# Patient Record
Sex: Female | Born: 1935 | Race: White | Hispanic: No | Marital: Married | Smoking: Former smoker
Health system: Southern US, Community
[De-identification: ages and names within clinical notes are randomized; demographics above are authoritative.]

## PROBLEM LIST (undated history)

## (undated) DIAGNOSIS — M810 Age-related osteoporosis without current pathological fracture: Secondary | ICD-10-CM

## (undated) DIAGNOSIS — F0390 Unspecified dementia without behavioral disturbance: Secondary | ICD-10-CM

## (undated) DIAGNOSIS — H3530 Unspecified macular degeneration: Secondary | ICD-10-CM

## (undated) DIAGNOSIS — E785 Hyperlipidemia, unspecified: Secondary | ICD-10-CM

## (undated) HISTORY — PX: HIP SURGERY: SHX245

## (undated) HISTORY — PX: ABDOMINAL SURGERY: SHX537

---

## 2004-08-04 ENCOUNTER — Ambulatory Visit: Payer: Self-pay | Admitting: Internal Medicine

## 2005-09-28 ENCOUNTER — Ambulatory Visit: Payer: Self-pay | Admitting: Internal Medicine

## 2006-10-02 ENCOUNTER — Ambulatory Visit: Payer: Self-pay | Admitting: Internal Medicine

## 2007-10-03 ENCOUNTER — Ambulatory Visit: Payer: Self-pay | Admitting: Internal Medicine

## 2013-07-04 ENCOUNTER — Inpatient Hospital Stay: Payer: Self-pay | Admitting: Internal Medicine

## 2013-07-04 LAB — COMPREHENSIVE METABOLIC PANEL
Albumin: 3.4 g/dL (ref 3.4–5.0)
Alkaline Phosphatase: 74 U/L
BUN: 37 mg/dL — ABNORMAL HIGH (ref 7–18)
Calcium, Total: 9.6 mg/dL (ref 8.5–10.1)
Chloride: 107 mmol/L (ref 98–107)
Co2: 27 mmol/L (ref 21–32)
Creatinine: 1.36 mg/dL — ABNORMAL HIGH (ref 0.60–1.30)
Glucose: 138 mg/dL — ABNORMAL HIGH (ref 65–99)
SGOT(AST): 24 U/L (ref 15–37)
Sodium: 141 mmol/L (ref 136–145)

## 2013-07-04 LAB — URINALYSIS, COMPLETE
Bilirubin,UR: NEGATIVE
Glucose,UR: NEGATIVE mg/dL (ref 0–75)
Ph: 6 (ref 4.5–8.0)
Protein: 30
RBC,UR: 1 /HPF (ref 0–5)
Specific Gravity: 1.016 (ref 1.003–1.030)
WBC UR: 1 /HPF (ref 0–5)

## 2013-07-04 LAB — CBC
HGB: 13 g/dL (ref 12.0–16.0)
MCH: 31 pg (ref 26.0–34.0)
RDW: 14.5 % (ref 11.5–14.5)

## 2013-07-04 LAB — PROTIME-INR: INR: 0.9

## 2013-07-05 LAB — BASIC METABOLIC PANEL
Anion Gap: 9 (ref 7–16)
BUN: 28 mg/dL — ABNORMAL HIGH (ref 7–18)
Calcium, Total: 8.6 mg/dL (ref 8.5–10.1)
EGFR (African American): 45 — ABNORMAL LOW
EGFR (Non-African Amer.): 39 — ABNORMAL LOW
Glucose: 126 mg/dL — ABNORMAL HIGH (ref 65–99)
Osmolality: 277 (ref 275–301)
Potassium: 3.8 mmol/L (ref 3.5–5.1)

## 2013-07-05 LAB — CBC WITH DIFFERENTIAL/PLATELET
Basophil #: 0 10*3/uL (ref 0.0–0.1)
Basophil %: 0.1 %
Eosinophil #: 0 10*3/uL (ref 0.0–0.7)
HCT: 30 % — ABNORMAL LOW (ref 35.0–47.0)
HGB: 9.9 g/dL — ABNORMAL LOW (ref 12.0–16.0)
Lymphocyte #: 1.3 10*3/uL (ref 1.0–3.6)
Lymphocyte %: 11.4 %
MCH: 30.4 pg (ref 26.0–34.0)
MCHC: 32.8 g/dL (ref 32.0–36.0)
Monocyte #: 1.3 x10 3/mm — ABNORMAL HIGH (ref 0.2–0.9)
Neutrophil %: 77 %
Platelet: 236 10*3/uL (ref 150–440)
WBC: 11.1 10*3/uL — ABNORMAL HIGH (ref 3.6–11.0)

## 2013-07-06 LAB — HEMOGLOBIN: HGB: 9.7 g/dL — ABNORMAL LOW (ref 12.0–16.0)

## 2013-07-07 ENCOUNTER — Encounter: Payer: Self-pay | Admitting: Internal Medicine

## 2013-07-15 LAB — URINALYSIS, COMPLETE
Bilirubin,UR: NEGATIVE
Blood: NEGATIVE
Nitrite: NEGATIVE
Ph: 6 (ref 4.5–8.0)
Protein: NEGATIVE
Specific Gravity: 1.02 (ref 1.003–1.030)
Squamous Epithelial: 5
WBC UR: 9 /HPF (ref 0–5)

## 2013-07-17 LAB — URINE CULTURE

## 2013-07-22 LAB — CBC WITH DIFFERENTIAL/PLATELET
Basophil #: 0 10*3/uL (ref 0.0–0.1)
Eosinophil #: 0.3 10*3/uL (ref 0.0–0.7)
HGB: 9.5 g/dL — ABNORMAL LOW (ref 12.0–16.0)
Lymphocyte #: 1.7 10*3/uL (ref 1.0–3.6)
MCH: 29.7 pg (ref 26.0–34.0)
MCHC: 32.1 g/dL (ref 32.0–36.0)
MCV: 93 fL (ref 80–100)
Monocyte #: 0.6 x10 3/mm (ref 0.2–0.9)
Neutrophil #: 4.6 10*3/uL (ref 1.4–6.5)
Neutrophil %: 62.9 %
Platelet: 591 10*3/uL — ABNORMAL HIGH (ref 150–440)
RBC: 3.18 10*6/uL — ABNORMAL LOW (ref 3.80–5.20)
RDW: 15 % — ABNORMAL HIGH (ref 11.5–14.5)
WBC: 7.3 10*3/uL (ref 3.6–11.0)

## 2013-07-22 LAB — COMPREHENSIVE METABOLIC PANEL
Albumin: 2.9 g/dL — ABNORMAL LOW (ref 3.4–5.0)
Alkaline Phosphatase: 155 U/L — ABNORMAL HIGH
BUN: 39 mg/dL — ABNORMAL HIGH (ref 7–18)
Bilirubin,Total: 0.5 mg/dL (ref 0.2–1.0)
Chloride: 100 mmol/L (ref 98–107)
Co2: 29 mmol/L (ref 21–32)
EGFR (Non-African Amer.): 37 — ABNORMAL LOW
Glucose: 86 mg/dL (ref 65–99)
Osmolality: 277 (ref 275–301)
SGOT(AST): 19 U/L (ref 15–37)
SGPT (ALT): 20 U/L (ref 12–78)
Sodium: 134 mmol/L — ABNORMAL LOW (ref 136–145)

## 2013-07-31 ENCOUNTER — Encounter: Payer: Self-pay | Admitting: Internal Medicine

## 2013-08-21 LAB — URINALYSIS, COMPLETE
Bacteria: NONE SEEN
Bilirubin,UR: NEGATIVE
Blood: NEGATIVE
Glucose,UR: 50 mg/dL (ref 0–75)
Hyaline Cast: 1
KETONE: NEGATIVE
Leukocyte Esterase: NEGATIVE
NITRITE: NEGATIVE
Ph: 6 (ref 4.5–8.0)
SPECIFIC GRAVITY: 1.016 (ref 1.003–1.030)
Squamous Epithelial: <1
Transitional Epi: <1
WBC UR: NONE SEEN /HPF (ref 0–5)

## 2013-08-22 LAB — CBC WITH DIFFERENTIAL/PLATELET
Basophil #: 0 10*3/uL (ref 0.0–0.1)
Basophil %: 0.3 %
Eosinophil #: 0 10*3/uL (ref 0.0–0.7)
Eosinophil %: 0.3 %
HCT: 34.5 % — ABNORMAL LOW (ref 35.0–47.0)
HGB: 11 g/dL — AB (ref 12.0–16.0)
Lymphocyte #: 1.7 10*3/uL (ref 1.0–3.6)
Lymphocyte %: 11.3 %
MCH: 29.6 pg (ref 26.0–34.0)
MCHC: 32 g/dL (ref 32.0–36.0)
MCV: 93 fL (ref 80–100)
MONO ABS: 0.9 x10 3/mm (ref 0.2–0.9)
Monocyte %: 6.3 %
Neutrophil #: 12.1 10*3/uL — ABNORMAL HIGH (ref 1.4–6.5)
Neutrophil %: 81.8 %
Platelet: 246 10*3/uL (ref 150–440)
RBC: 3.73 10*6/uL — ABNORMAL LOW (ref 3.80–5.20)
RDW: 14.9 % — AB (ref 11.5–14.5)
WBC: 14.7 10*3/uL — ABNORMAL HIGH (ref 3.6–11.0)

## 2013-08-22 LAB — BASIC METABOLIC PANEL
Anion Gap: 4 — ABNORMAL LOW (ref 7–16)
BUN: 47 mg/dL — ABNORMAL HIGH (ref 7–18)
CALCIUM: 8.9 mg/dL (ref 8.5–10.1)
CHLORIDE: 103 mmol/L (ref 98–107)
Co2: 30 mmol/L (ref 21–32)
Creatinine: 1.44 mg/dL — ABNORMAL HIGH (ref 0.60–1.30)
EGFR (Non-African Amer.): 32 — ABNORMAL LOW
GFR CALC AF AMER: 37 — AB
GLUCOSE: 88 mg/dL (ref 65–99)
Osmolality: 285 (ref 275–301)
POTASSIUM: 4.4 mmol/L (ref 3.5–5.1)
SODIUM: 137 mmol/L (ref 136–145)

## 2013-08-22 LAB — RAPID INFLUENZA A&B ANTIGENS

## 2013-08-23 LAB — URINE CULTURE

## 2014-07-09 ENCOUNTER — Ambulatory Visit: Payer: Self-pay

## 2014-07-11 ENCOUNTER — Ambulatory Visit: Payer: Self-pay

## 2014-07-11 LAB — URINALYSIS, COMPLETE
Bilirubin,UR: NEGATIVE
Blood: NEGATIVE
Glucose,UR: NEGATIVE
Ketone: NEGATIVE
Nitrite: NEGATIVE
Ph: 5.5 (ref 5.0–8.0)
Protein: NEGATIVE
Specific Gravity: 1.015 (ref 1.000–1.030)
WBC UR: >30 /HPF (ref 0–5)

## 2014-07-13 LAB — URINE CULTURE

## 2014-11-20 NOTE — Op Note (Signed)
PATIENT NAME:  Nadara MustardRIGHT, Janeah MR#:  [id number] DATE OF BIRTH:  1921-02-09  DATE OF PROCEDURE:  07/04/2013  PREOPERATIVE DIAGNOSIS: Left intertrochanteric hip fracture, displaced.   POSTOPERATIVE DIAGNOSIS: Left intertrochanteric hip fracture, displaced.   PROCEDURE: Open reduction and internal fixation, left intertrochanteric hip fracture, with intramedullary device.   ANESTHESIA: Spinal.   SURGEON: Kennedy BuckerMichael Alease Fait, MD   DESCRIPTION OF PROCEDURE: The patient was brought to the operating room and after adequate spinal anesthesia was obtained, the patient was placed on the fracture table with the [employer] leg in the well leg holder, left leg in the traction boot. Leg was brought out into traction and AP and lateral images obtained, showing essentially anatomic alignment with distraction. The hip was then prepped and draped using the barrier drape method. Appropriate patient identification and timeout procedures were completed. An incision was made just proximal to the tip of the greater trochanter and a guidewire inserted into the tip of the trochanter on the AP, midline on the lateral view, and proximal reaming carried out. A long guidewire was then inserted down the canal and measurements made off of this for rod length. Reaming of the canal was carried out to 13 mm. An 11 x 340 mm Affixus nail was obtained and inserted over the wire to the appropriate level. The guidewire was removed. Through a small lateral incision using the 130 degree guide, a guidewire was inserted into center position of the head on the AP, slightly posterior on the lateral view, and this was measured, drilled, and a lag screw 100 mm in length was inserted. At this point, traction was released. The locking portion of the nail proximally was set, first tightening and then releasing a quarter turn to allow for compression. The lateral compression device was then used with the traction off the foot to get compression at the fracture site.  There appeared to be good position of all hardware at this point. The insertion handle was removed at this point and attention turned distally with a good lateral view of the distal femur. A single distal interlocking screw was made through the oblong hole of the distal shaft, first making a small stab incision and spreading the tissue with a hemostat, drilling, measuring, and placing a 42 mm, 5.0 distal interlocking screw. At this point, the wounds were irrigated. Permanent C-arm views were obtained. The wounds were closed with #1 Vicryl for the deep fascia in the most proximal incision, 2-0 Vicryl subcutaneously, and skin staples. Xeroform, 4 x 4's, ABD, and tape applied. The patient was sent to the recovery room in stable condition.   ESTIMATED BLOOD LOSS: 100 mL.   COMPLICATIONS: None.   SPECIMEN: None.   IMPLANT: Biomet long Affixus left nail, 11 x 340 mm with 100 mm lag screw, 42 mm distal interlocking screw.    ____________________________ Leitha SchullerMichael J. Berneice Zettlemoyer, MD mjm:jcm D: 07/04/2013 18:01:00 ET T: 07/04/2013 22:09:12 ET JOB#: [id number]  cc: Leitha SchullerMichael J. Cullen Lahaie, MD, <Dictator> Leitha SchullerMICHAEL J Jonny Longino MD ELECTRONICALLY SIGNED 07/06/2013 23:30

## 2014-11-20 NOTE — Discharge Summary (Signed)
PATIENT NAME:  Adrienne Humphrey, Adrienne Humphrey MR#:  [id number] DATE OF BIRTH:  04-30-1921  DATE OF ADMISSION:  07/04/2013 DATE OF DISCHARGE:  07/07/2013  HISTORY OF PRESENT ILLNESS: Adrienne Humphrey is a 81 year old lady, who presented to the Emergency Room after a mechanical fall at home resulting in left hip pain. In the ER, the patient was found to have a left intertrochanteric hip fracture and was therefore admitted.   PAST MEDICAL HISTORY: Notable for essential hypertension. She had also had previous abdominal surgery for a benign tumor. The patient was known to have mild cognitive impairment.   ALLERGIES: SULFA DRUGS.   ADMISSION MEDICATIONS: Were not dictated by the admitting physician.   ADMISSION PHYSICAL EXAM: As described by the admitting physician revealed an elderly, frail, white lady with left hip pain. Vital signs were normal with the exception of a blood pressure of 176/90. The remainder of the exam was rather unremarkable with the exception of pain on movement of the left hip.   ADMISSION LABORATORY WORK: Revealed a blood sugar of 130, BUN of 37, creatinine 1.36, sodium 141 and potassium 4.3. Liver function studies were normal. Hemoglobin was 13. Platelet count was 301,000. White count was 16,000. Urinalysis showed 3+ bacteria. EKG showed a normal sinus rhythm with poor R wave progression. A subsequent urine culture showed no growth.   HOSPITAL COURSE: The patient was admitted to the regular medical floor where she was seen in consultation by orthopedics and was taken to surgery on 07/04/13. She underwent an ORIF of the left hip. There were no surgical complications. The patient was also seen during her hospitalization by physical therapy and occupational therapy.   DISCHARGE DIAGNOSES:  1.  Open reduction internal fixation of the left hip.  2.  Hypertension.   DISCHARGE MEDICATIONS:  1.  Tylenol 650 mg every 4 hours as needed for pain or fever.  2.  Norco 5/325 mg 1 to 2 tablets every 4 hours as  needed for pain.  3.  Protonix 40 mg daily.  4.  Senokot 1 tablet b.i.d. p.r.n. constipation.  5.  Colace 100 mg p.o. b.i.d. p.r.n. constipation.  6.  Dulcolax suppository 10 mg rectally at bedtime p.r.n. for constipation.  7.  Surfak 240 mg at bedtime.  8.  Lumigan 0.01% ophthalmic solution 2 drops to both eyes at bedtime.  9.  Refresh Celluvisc ophthalmic drops 2 drops to both eyes b.i.d.  10. Diovan 80 mg daily.  11. Combigan 1 drop to both eyes b.i.d.   DISCHARGE DISPOSITION: The patient is being transferred to a rehab facility on a regular diet with activity as tolerated. She will receive physical therapy while in the rehab unit. The patient [facility] be [facility].   CODE STATUS: The patient remains a full code. ____________________________ Letta PateJohn B. Danne HarborWalker III, MD jbw:aw D: 07/07/2013 08:03:36 ET T: 07/07/2013 08:15:00 ET JOB#: [id number]  cc: Jonny RuizJohn B. Danne HarborWalker III, MD, <Dictator> Elmo PuttJOHN B WALKER III MD ELECTRONICALLY SIGNED 07/07/2013 12:03

## 2014-11-20 NOTE — Consult Note (Signed)
Brief Consult Note: Diagnosis: left unstable intertrochanteric hip fracture.   Patient was seen by consultant.   Recommend to proceed with surgery or procedure.   Orders entered.   Comments: Plan ORIF later today with IM device. Risks, benefits and possible complications discussed.  Electronic Signatures: Leitha SchullerMenz, Hulbert Branscome J (MD)  (Signed 05-Dec-14 08:29)  Authored: Brief Consult Note   Last Updated: 05-Dec-14 08:29 by Leitha SchullerMenz, Alcee Sipos J (MD)

## 2014-11-20 NOTE — Consult Note (Signed)
PATIENT NAME:  Adrienne Humphrey, Adrienne Humphrey MR#:  [id number] DATE OF BIRTH:  [birth year]  DATE OF CONSULTATION:  07/04/2013  CONSULTING PHYSICIAN:  Leitha SchullerMichael J. Sabreen Kitchen, MD  REASON FOR CONSULTATION: Left hip fracture.   HISTORY OF PRESENT ILLNESS: The patient is a 81 year old who suffered a mechanical fall. She got up and her leg just went out on her, suspect fracture then fall. She laid down on her left side was unable to bear weight. She was brought to the Emergency Room and she was found to have a displaced unstable intertrochanteric hip fracture. She has been ambulatory prior to this around her home. She denies loss of consciousness.  On exam, her left leg is shortened with external rotation. Skin is intact about the hip. She is able to flex and extend the toes and has a palpable pulse.   RADIOLOGICAL DATA: X-rays reveal comminuted displaced left intertrochanteric hip fracture.  CLINICAL IMPRESSION:  Displaced intertrochanteric hip fracture.   PLAN:  For ORIF with long intramedullary device.   ____________________________ Leitha SchullerMichael J. Chaslyn Eisen, MD mjm:ce D: 07/04/2013 14:51:57 ET T: 07/04/2013 15:04:15 ET JOB#: [id number]  cc: Leitha SchullerMichael J. Efrata Brunner, MD, <Dictator> Leitha SchullerMICHAEL J Dmitry Macomber MD ELECTRONICALLY SIGNED 07/04/2013 17:18

## 2014-11-20 NOTE — H&P (Signed)
PATIENT NAME:  Adrienne Humphrey, Evoleht MR#:  [id number] DATE OF BIRTH:  Dec 19, 1920  DATE OF ADMISSION:  07/04/2013  PRIMARY CARE PHYSICIAN:  Dr. Yates DecampJohn Walker.   CHIEF COMPLAINT:  Fall with left hip pain.   HISTORY OF PRESENT ILLNESS:  A 81 year old female patient who at baseline ambulates with a walker, was in the bathroom trying to turn to grab some clothes, fell down landing on her left hip and presented to the Emergency Room with severe pain in the left hip.  Here, she has been found to have a left intertrochanteric hip fracture and is being admitted for surgery.  She did not black out.  Did not hit her head.  Has been feeling fine until she had the fall.  The patient will be seen by orthopedic Dr. Rosita KeaMenz.   PAST MEDICAL HISTORY: 1.  Hypertension.  2.  Previous abdominal surgery for a benign tumor of unknown kind.   SOCIAL HISTORY:  The patient used to smoke in the past, quit in 1970.  No alcohol.  No illicit drugs.  Lives at home with husband.  Ambulates with a walker.   CODE STATUS:  FULL CODE.   HOME MEDICATIONS:  Unknown at this time.   ALLERGIES:  SULFA DRUGS.   FAMILY HISTORY:  Hypertension.   REVIEW OF SYSTEMS: CONSTITUTIONAL:  No fever, fatigue, weakness.  EYES:  No blurred vision, pain, redness. EARS, NOSE, THROAT:  No tinnitus, ear pain, hearing loss.  RESPIRATORY:  No cough, wheezing.  CARDIOVASCULAR:  No chest pain, orthopnea, edema.   GASTROINTESTINAL:  No nausea, vomiting, diarrhea.  GENITOURINARY:  No significant dysuria, hematuria.  ENDOCRINE:  No polyuria, nocturia, thyroid problems.  HEMATOLOGIC AND LYMPHATIC:  No anemia, easy bruising, bleeding.  INTEGUMENTARY:  No acne, rash, lesion.  MUSCULOSKELETAL:  Has pain in the left hip with a left femur fracture.  NEUROLOGIC:  No focal numbness, weakness, seizures.  PSYCHIATRIC:  No anxiety or depression.   PHYSICAL EXAMINATION: VITAL SIGNS:  Temperature 97.6, pulse is 65, blood pressure 176/90, saturating 95% on room air.   GENERAL:  Elderly, frail Caucasian female patient lying in bed.  Pleasant and conversational.  PSYCHIATRIC:  Alert and oriented x 3.  Mood and affect appropriate.  Judgment intact.  HEENT:  Atraumatic, normocephalic.  Oral mucosa moist and pink.  External ears and nose normal.  No pallor.  No icterus.  Pupils bilaterally equal and reactive to light.  NECK:  Supple.  No thyromegaly.  No palpable lymph nodes.  Trachea midline.  No carotid bruit or JVD.  CARDIOVASCULAR:  S1, S2, without any murmurs.  Peripheral pulses 2+.  RESPIRATORY:  Normal work of breathing.  Clear to auscultation on both sides.  GASTROINTESTINAL:  Soft abdomen, nontender.  Bowel sounds present.  No visceromegaly palpable.  GENITOURINARY:  No significant bladder distention.   SKIN:  Warm and dry.  No petechiae, rash, ulcers.  MUSCULOSKELETAL:  No joint swelling, redness, effusion of the large joints.  Has decreased range of motion and tenderness of the left hip.  LYMPHATIC:  No cervical lymphadenopathy.  NEUROLOGICAL:  Motor strength 5 by 5 in upper and lower extremities.  Limited assessment in the left lower extremity.  Sensation  is intact all over.  VASCULAR:  Has 2+ pulses in the lower extremities and upper extremities.   LABORATORY STUDIES:  Glucose 130, BUN 37, creatinine 1.36, sodium 141, potassium 4.3.  AST, ALT, alkaline phosphatase, bilirubin normal.  WBC 16, hemoglobin 13, platelets of 301.  Urinalysis shows 3+  bacteria and 1 WBC.  EKG shows normal sinus rhythm, poor R wave progression.   X-ray of the left hip shows left intertrochanteric hip fracture.   ASSESSMENT AND PLAN: 1.  Left intertrochanteric femur fracture.  Consult orthopedics.  Management per orthopedics.  The patient does not have any significant comorbidities.  Would be low to moderate risk with her risk being higher just because of the age.  We will monitor for any complications.  Deep vein thrombosis prophylaxis per orthopedics. PT to see patient  after surgery.  Consult discharge planner for rehab.  2.  Urinary tract infection with leukocytosis.  Start on ciprofloxacin.  3.  Hypertension.  Home medications are unknown.  We will start on IV hydralazine as needed as the patient is nothing by mouth.  Start on home medications when available.  4.  Acute renal failure versus chronic kidney disease stage 3.  The patient's creatinine is 1.36.  We will be on intravenous fluids and monitor.  Likely has baseline chronic kidney disease stage 3.  5.  Deep vein thrombosis prophylaxis per Dr. Rosita Kea.  6.  CODE STATUS:  FULL CODE.   Time spent today on this case was 45 minutes.    ____________________________ Molinda Bailiff Khushbu Pippen, MD srs:ea D: 07/04/2013 02:02:24 ET T: 07/04/2013 02:53:25 ET JOB#: [id number]  cc: Wardell Heath R. Nochum Fenter, MD, <Dictator> John B. Danne Harbor, MD Wardell Heath West Bali MD ELECTRONICALLY SIGNED 07/09/2013 14:09

## 2015-01-27 ENCOUNTER — Ambulatory Visit
Admission: EM | Admit: 2015-01-27 | Discharge: 2015-01-27 | Discharge status: Absent without leave | Payer: Medicare Other

## 2015-01-27 NOTE — ED Notes (Signed)
Spoke to patient and her husband as she arrived with xray showing (+) radius fx. [facility] and was directed to urgent care instead of orthopedics.   Writer called [facility] and they will call me back with appt for today shortly.

## 2015-01-27 NOTE — ED Notes (Signed)
Return call from [facility]. Freddie opened appt for now. [facility] there now.

## 2015-07-02 ENCOUNTER — Emergency Department: Payer: Medicare Other

## 2015-07-02 ENCOUNTER — Encounter: Payer: Self-pay | Admitting: Emergency Medicine

## 2015-07-02 ENCOUNTER — Emergency Department
Admission: EM | Admit: 2015-07-02 | Discharge: 2015-07-02 | Disposition: A | Discharge status: Home / Self Care | Payer: Medicare Other | Attending: Emergency Medicine | Admitting: Emergency Medicine

## 2015-07-02 DIAGNOSIS — S0083XA Contusion of other part of head, initial encounter: Secondary | ICD-10-CM | POA: Insufficient documentation

## 2015-07-02 DIAGNOSIS — Z7902 Long term (current) use of antithrombotics/antiplatelets: Secondary | ICD-10-CM | POA: Diagnosis not present

## 2015-07-02 DIAGNOSIS — Z87891 Personal history of nicotine dependence: Secondary | ICD-10-CM | POA: Diagnosis not present

## 2015-07-02 DIAGNOSIS — Z7982 Long term (current) use of aspirin: Secondary | ICD-10-CM | POA: Diagnosis not present

## 2015-07-02 DIAGNOSIS — W06XXXA Fall from bed, initial encounter: Secondary | ICD-10-CM | POA: Diagnosis not present

## 2015-07-02 DIAGNOSIS — S0012XA Contusion of left eyelid and periocular area, initial encounter: Secondary | ICD-10-CM | POA: Insufficient documentation

## 2015-07-02 DIAGNOSIS — Z79899 Other long term (current) drug therapy: Secondary | ICD-10-CM | POA: Diagnosis not present

## 2015-07-02 DIAGNOSIS — Y9289 Other specified places as the place of occurrence of the external cause: Secondary | ICD-10-CM | POA: Diagnosis not present

## 2015-07-02 DIAGNOSIS — Y998 Other external cause status: Secondary | ICD-10-CM | POA: Diagnosis not present

## 2015-07-02 DIAGNOSIS — S0990XA Unspecified injury of head, initial encounter: Secondary | ICD-10-CM | POA: Diagnosis present

## 2015-07-02 DIAGNOSIS — Y9389 Activity, other specified: Secondary | ICD-10-CM | POA: Insufficient documentation

## 2015-07-02 HISTORY — DX: Hyperlipidemia, unspecified: E78.5

## 2015-07-02 HISTORY — DX: Unspecified macular degeneration: H35.30

## 2015-07-02 HISTORY — DX: Unspecified dementia, unspecified severity, without behavioral disturbance, psychotic disturbance, mood disturbance, and anxiety: F03.90

## 2015-07-02 HISTORY — DX: Age-related osteoporosis without current pathological fracture: M81.0

## 2015-07-02 NOTE — ED Notes (Signed)
Patient back from CT.

## 2015-07-02 NOTE — Discharge Instructions (Signed)

## 2015-07-02 NOTE — ED Provider Notes (Signed)
[facility] Emergency Department Provider Note  ____________________________________________  Time seen: 2:15 AM  I have reviewed the triage vital signs and the nursing notes.   HISTORY  Chief Complaint Fall      HPI Adrienne Humphrey is a 81 y.o. female presents with history of rolling out of bed tonight with resultant forehead/periorbital injury. Patient describes her discomfort at this time is mild.    Past Medical History  Diagnosis Date  . Hyperlipidemia   . Dementia   . Macular degeneration   . Osteoporosis     There are no active problems to display for this patient.   History reviewed. No pertinent past surgical history.  Current Outpatient Rx  Name  Route  Sig  Dispense  Refill  . aspirin 81 MG chewable tablet   Oral   Chew 81 mg by mouth daily.         . Cholecalciferol (VITAMIN D) 2000 UNITS CAPS   Oral   Take 2,000 Units by mouth daily.         . clopidogrel (PLAVIX) 75 MG tablet   Oral   Take 75 mg by mouth daily.         Marland Kitchen. docusate calcium (SURFAK) 240 MG capsule   Oral   Take 240 mg by mouth at bedtime.         . ENSURE (ENSURE)   Oral   Take 237 mLs by mouth 3 (three) times daily between meals.         . mirtazapine (REMERON) 15 MG tablet   Oral   Take 7.5 mg by mouth at bedtime.         . Multiple Vitamin (MULTIVITAMIN WITH MINERALS) TABS tablet   Oral   Take 1 tablet by mouth daily.         . pantoprazole (PROTONIX) 40 MG tablet   Oral   Take 40 mg by mouth daily.         . valsartan (DIOVAN) 80 MG tablet   Oral   Take 80 mg by mouth daily.         . vitamin C (ASCORBIC ACID) 500 MG tablet   Oral   Take 500 mg by mouth 2 (two) times daily.           Allergies Ace inhibitors and Sulfa antibiotics  No family history on file.  Social History Social History  Substance Use Topics  . Smoking status: Former Games developermoker  . Smokeless tobacco: None  . Alcohol Use: No    Review of  Systems  Constitutional: Negative for fever. Eyes: Negative for visual changes. ENT: Negative for sore throat. Cardiovascular: Negative for chest pain. Respiratory: Negative for shortness of breath. Gastrointestinal: Negative for abdominal pain, vomiting and diarrhea. Genitourinary: Negative for dysuria. Musculoskeletal: Negative for back pain. Skin: Negative for rash. Positive for a contusion Neurological: Negative for headaches, focal weakness or numbness.   10-point ROS otherwise negative.  ____________________________________________   PHYSICAL EXAM:  VITAL SIGNS: ED Triage Vitals  Enc Vitals Group     BP 07/02/15 0218 184/84 mmHg     Pulse Rate 07/02/15 0218 82     Resp 07/02/15 0218 18     Temp 07/02/15 0218 97.9 F (36.6 C)     Temp Source 07/02/15 0218 Oral     SpO2 07/02/15 0222 97 %     Weight 07/02/15 0222 125 lb (56.7 kg)     Height 07/02/15 0222 5\' 4"  (1.626 m)  Head Cir --      Peak Flow --      Pain Score 07/02/15 0224 5     Pain Loc --      Pain Edu? --      Excl. in GC? --     Constitutional: Alert and oriented. Well appearing and in no distress. Eyes: Conjunctivae are normal. PERRL. Normal extraocular movements. ENT   Head: Normocephalic and atraumatic.   Nose: No congestion/rhinnorhea.   Mouth/Throat: Mucous membranes are moist.   Neck: No stridor. Hematological/Lymphatic/Immunilogical: No cervical lymphadenopathy. Cardiovascular: Normal rate, regular rhythm. Normal and symmetric distal pulses are present in all extremities. No murmurs, rubs, or gallops. Respiratory: Normal respiratory effort without tachypnea nor retractions. Breath sounds are clear and equal bilaterally. No wheezes/rales/rhonchi. Gastrointestinal: Soft and nontender. No distention. There is no CVA tenderness. Genitourinary: deferred Musculoskeletal: Nontender with normal range of motion in all extremities. No joint effusions.  No lower extremity tenderness nor  edema. Neurologic:  Normal speech and language. No gross focal neurologic deficits are appreciated. Speech is normal.  Skin:  Skin is warm, dry and intact. No rash noted. Left periorbital contusion with swelling. Psychiatric: Mood and affect are normal. Speech and behavior are normal. Patient exhibits appropriate insight and judgment.  ____________________________________________       RADIOLOGY  CT Head Wo Contrast (Final result) Result time: 07/02/15 03:32:30   Final result by Rad Results In Interface (07/02/15 03:32:30)   Narrative:   CLINICAL DATA: Larey Seat. Large left frontal hematoma.  EXAM: CT HEAD WITHOUT CONTRAST  CT CERVICAL SPINE WITHOUT CONTRAST  TECHNIQUE: Multidetector CT imaging of the head and cervical spine was performed following the standard protocol without intravenous contrast. Multiplanar CT image reconstructions of the cervical spine were also generated.  COMPARISON: None  FINDINGS: CT HEAD FINDINGS  There is no intracranial hemorrhage, mass or evidence of acute infarction. There is no extra-axial fluid collection. There is moderately severe generalized atrophy. There is moderately severe white matter hypodensity consistent with chronic small vessel ischemic disease. There is remote infarction in the left caudate, lentiform nuclei and frontal periventricular white matter. No acute intracranial findings are evident. Calvarium and skullbase are intact. There is a prominent hematoma of the left frontal scalp extending onto the bridge of the nose and into the medial left periorbital soft tissues. Optic globes and other orbital structures appear intact.  CT CERVICAL SPINE FINDINGS  The vertebral column, pedicles and facet articulations are intact. There is no evidence of acute fracture. No acute soft tissue abnormalities are evident.  Moderately severe cervical degenerative disc disease is present with prominent posterior osteophytes and with  moderate kyphosis. No bone lesion or bony destruction. Mild left convex curvature centered at C5.  IMPRESSION: 1. Negative for acute intracranial traumatic injury. There is moderately severe generalized atrophy and chronic small vessel ischemic disease. There is remote infarction. 2. Large left frontal scalp hematoma extending into the left periorbital and nasal regions without evidence of acute fracture. 3. Negative for acute cervical spine fracture. Moderately severe degenerative change and curvature is present.   Electronically Signed By: Ellery Plunk M.D. On: 07/02/2015 03:32          CT Cervical Spine Wo Contrast (Final result) Result time: 07/02/15 03:32:30   Final result by Rad Results In Interface (07/02/15 03:32:30)   Narrative:   CLINICAL DATA: Larey Seat. Large left frontal hematoma.  EXAM: CT HEAD WITHOUT CONTRAST  CT CERVICAL SPINE WITHOUT CONTRAST  TECHNIQUE: Multidetector CT imaging of the  head and cervical spine was performed following the standard protocol without intravenous contrast. Multiplanar CT image reconstructions of the cervical spine were also generated.  COMPARISON: None  FINDINGS: CT HEAD FINDINGS  There is no intracranial hemorrhage, mass or evidence of acute infarction. There is no extra-axial fluid collection. There is moderately severe generalized atrophy. There is moderately severe white matter hypodensity consistent with chronic small vessel ischemic disease. There is remote infarction in the left caudate, lentiform nuclei and frontal periventricular white matter. No acute intracranial findings are evident. Calvarium and skullbase are intact. There is a prominent hematoma of the left frontal scalp extending onto the bridge of the nose and into the medial left periorbital soft tissues. Optic globes and other orbital structures appear intact.  CT CERVICAL SPINE FINDINGS  The vertebral column, pedicles and facet  articulations are intact. There is no evidence of acute fracture. No acute soft tissue abnormalities are evident.  Moderately severe cervical degenerative disc disease is present with prominent posterior osteophytes and with moderate kyphosis. No bone lesion or bony destruction. Mild left convex curvature centered at C5.  IMPRESSION: 1. Negative for acute intracranial traumatic injury. There is moderately severe generalized atrophy and chronic small vessel ischemic disease. There is remote infarction. 2. Large left frontal scalp hematoma extending into the left periorbital and nasal regions without evidence of acute fracture. 3. Negative for acute cervical spine fracture. Moderately severe degenerative change and curvature is present.   Electronically Signed By: Ellery Plunk M.D. On: 07/02/2015 03:32          INITIAL IMPRESSION / ASSESSMENT AND PLAN / ED COURSE  Pertinent labs & imaging results that were available during my care of the patient were reviewed by me and considered in my medical decision making (see chart for details).    ____________________________________________   FINAL CLINICAL IMPRESSION(S) / ED DIAGNOSES  Final diagnoses:  Forehead contusion, initial encounter      Darci Current, MD 07/02/15 [phone]

## 2015-07-02 NOTE — ED Notes (Signed)
Patient with no complaints at this time. Respirations even and unlabored. Skin warm/dry. Discharge instructions reviewed with patient at this time. Patient given opportunity to voice concerns/ask questions. Patient discharged at this time and left Emergency Department with steady gait. Discharge disposition instructions paged, via telephone, to [facility].

## 2015-07-02 NOTE — ED Notes (Signed)
Spoke with Barkley BrunsKristine, Med Tech at [facility] for discharge disposition, via telephone call.

## 2015-07-02 NOTE — ED Notes (Signed)
Pt presents to ED via EMS [facility] with c/o of acute fall episode, witnessed by staff. EMS states pt was being physically dressed by staff when she fell out of bed, witnessed by staff. EMS states pt has notable left side hematoma to left forehead and left eye. Pt arrived to ER alert and oriented x4. No bleeding noted to affected area at this time. MD at bedside.

## 2015-07-02 NOTE — ED Notes (Signed)
Patient transported to CT 

## 2015-08-20 ENCOUNTER — Emergency Department: Payer: Medicare Other

## 2015-08-20 ENCOUNTER — Encounter: Payer: Self-pay | Admitting: Emergency Medicine

## 2015-08-20 ENCOUNTER — Inpatient Hospital Stay
Admission: EM | Admit: 2015-08-20 | Discharge: 2015-08-23 | DRG: 371 | Disposition: A | Discharge status: Skilled Nursing Facility | Payer: Medicare Other | Attending: Internal Medicine | Admitting: Internal Medicine

## 2015-08-20 DIAGNOSIS — Z882 Allergy status to sulfonamides status: Secondary | ICD-10-CM

## 2015-08-20 DIAGNOSIS — F0390 Unspecified dementia without behavioral disturbance: Secondary | ICD-10-CM | POA: Diagnosis present

## 2015-08-20 DIAGNOSIS — H3530 Unspecified macular degeneration: Secondary | ICD-10-CM | POA: Diagnosis present

## 2015-08-20 DIAGNOSIS — M810 Age-related osteoporosis without current pathological fracture: Secondary | ICD-10-CM | POA: Diagnosis present

## 2015-08-20 DIAGNOSIS — Z66 Do not resuscitate: Secondary | ICD-10-CM | POA: Diagnosis present

## 2015-08-20 DIAGNOSIS — N289 Disorder of kidney and ureter, unspecified: Secondary | ICD-10-CM | POA: Diagnosis present

## 2015-08-20 DIAGNOSIS — Z888 Allergy status to other drugs, medicaments and biological substances status: Secondary | ICD-10-CM

## 2015-08-20 DIAGNOSIS — E860 Dehydration: Secondary | ICD-10-CM | POA: Diagnosis present

## 2015-08-20 DIAGNOSIS — Z79899 Other long term (current) drug therapy: Secondary | ICD-10-CM | POA: Diagnosis not present

## 2015-08-20 DIAGNOSIS — Z6820 Body mass index (BMI) 20.0-20.9, adult: Secondary | ICD-10-CM

## 2015-08-20 DIAGNOSIS — R64 Cachexia: Secondary | ICD-10-CM | POA: Diagnosis present

## 2015-08-20 DIAGNOSIS — Z7902 Long term (current) use of antithrombotics/antiplatelets: Secondary | ICD-10-CM

## 2015-08-20 DIAGNOSIS — M1990 Unspecified osteoarthritis, unspecified site: Secondary | ICD-10-CM | POA: Diagnosis present

## 2015-08-20 DIAGNOSIS — Z7982 Long term (current) use of aspirin: Secondary | ICD-10-CM

## 2015-08-20 DIAGNOSIS — E785 Hyperlipidemia, unspecified: Secondary | ICD-10-CM | POA: Diagnosis present

## 2015-08-20 DIAGNOSIS — E43 Unspecified severe protein-calorie malnutrition: Secondary | ICD-10-CM | POA: Diagnosis present

## 2015-08-20 DIAGNOSIS — A0472 Enterocolitis due to Clostridium difficile, not specified as recurrent: Secondary | ICD-10-CM | POA: Diagnosis present

## 2015-08-20 DIAGNOSIS — Z87891 Personal history of nicotine dependence: Secondary | ICD-10-CM

## 2015-08-20 DIAGNOSIS — I10 Essential (primary) hypertension: Secondary | ICD-10-CM | POA: Diagnosis present

## 2015-08-20 DIAGNOSIS — A047 Enterocolitis due to Clostridium difficile: Principal | ICD-10-CM | POA: Diagnosis present

## 2015-08-20 LAB — BASIC METABOLIC PANEL
Anion gap: 9 (ref 5–15)
BUN: 25 mg/dL — AB (ref 6–20)
CALCIUM: 8.8 mg/dL — AB (ref 8.9–10.3)
CO2: 22 mmol/L (ref 22–32)
CREATININE: 1.09 mg/dL — AB (ref 0.44–1.00)
Chloride: 107 mmol/L (ref 101–111)
GFR calc Af Amer: 49 mL/min — ABNORMAL LOW (ref >60)
GFR, EST NON AFRICAN AMERICAN: 43 mL/min — AB (ref >60)
Glucose, Bld: 103 mg/dL — ABNORMAL HIGH (ref 65–99)
POTASSIUM: 4.1 mmol/L (ref 3.5–5.1)
SODIUM: 138 mmol/L (ref 135–145)

## 2015-08-20 LAB — CBC
HEMATOCRIT: 36.8 % (ref 35.0–47.0)
Hemoglobin: 11.7 g/dL — ABNORMAL LOW (ref 12.0–16.0)
MCH: 28.1 pg (ref 26.0–34.0)
MCHC: 31.7 g/dL — ABNORMAL LOW (ref 32.0–36.0)
MCV: 88.8 fL (ref 80.0–100.0)
PLATELETS: 390 10*3/uL (ref 150–440)
RBC: 4.15 MIL/uL (ref 3.80–5.20)
RDW: 14.1 % (ref 11.5–14.5)
WBC: 20.1 10*3/uL — AB (ref 3.6–11.0)

## 2015-08-20 LAB — HEPATIC FUNCTION PANEL
ALK PHOS: 84 U/L (ref 38–126)
ALT: 11 U/L — AB (ref 14–54)
AST: 14 U/L — AB (ref 15–41)
Albumin: 3.1 g/dL — ABNORMAL LOW (ref 3.5–5.0)
Total Bilirubin: 0.7 mg/dL (ref 0.3–1.2)
Total Protein: 6.1 g/dL — ABNORMAL LOW (ref 6.5–8.1)

## 2015-08-20 LAB — C DIFFICILE QUICK SCREEN W PCR REFLEX
C DIFFICILE (CDIFF) INTERP: POSITIVE
C Diff antigen: POSITIVE — AB
C Diff toxin: POSITIVE — AB

## 2015-08-20 LAB — URINALYSIS COMPLETE WITH MICROSCOPIC (ARMC ONLY)
Bilirubin Urine: NEGATIVE
Glucose, UA: NEGATIVE mg/dL
Hgb urine dipstick: NEGATIVE
NITRITE: NEGATIVE
PH: 5 (ref 5.0–8.0)
PROTEIN: NEGATIVE mg/dL
SPECIFIC GRAVITY, URINE: 1.018 (ref 1.005–1.030)

## 2015-08-20 LAB — LIPASE, BLOOD: Lipase: 15 U/L (ref 11–51)

## 2015-08-20 LAB — TROPONIN I: TROPONIN I: 0.04 ng/mL — AB (ref <0.031)

## 2015-08-20 MED ORDER — VITAMIN C 500 MG PO TABS
500.0000 mg | ORAL_TABLET | Freq: Two times a day (BID) | ORAL | Status: DC
  Administered 2015-08-20 – 2015-08-23 (×6): 500 mg via ORAL
  Filled 2015-08-20 (×6): qty 1

## 2015-08-20 MED ORDER — POLYVINYL ALCOHOL 1.4 % OP SOLN
1.0000 [drp] | Freq: Four times a day (QID) | OPHTHALMIC | Status: DC | PRN
  Filled 2015-08-20: qty 15

## 2015-08-20 MED ORDER — PANTOPRAZOLE SODIUM 40 MG PO TBEC
40.0000 mg | DELAYED_RELEASE_TABLET | Freq: Every day | ORAL | Status: DC
  Administered 2015-08-20 – 2015-08-23 (×4): 40 mg via ORAL
  Filled 2015-08-20 (×4): qty 1

## 2015-08-20 MED ORDER — TIMOLOL MALEATE 0.5 % OP SOLN
1.0000 [drp] | Freq: Every day | OPHTHALMIC | Status: DC
Start: 2015-08-20 — End: 2015-08-23
  Administered 2015-08-20 – 2015-08-22 (×3): 1 [drp] via OPHTHALMIC
  Filled 2015-08-20: qty 5

## 2015-08-20 MED ORDER — CLOPIDOGREL BISULFATE 75 MG PO TABS
75.0000 mg | ORAL_TABLET | Freq: Every day | ORAL | Status: DC
  Administered 2015-08-20 – 2015-08-22 (×3): 75 mg via ORAL
  Filled 2015-08-20 (×3): qty 1

## 2015-08-20 MED ORDER — MIRTAZAPINE 15 MG PO TABS
7.5000 mg | ORAL_TABLET | Freq: Every day | ORAL | Status: DC
  Administered 2015-08-20 – 2015-08-22 (×3): 7.5 mg via ORAL
  Filled 2015-08-20 (×3): qty 1

## 2015-08-20 MED ORDER — ONDANSETRON HCL 4 MG PO TABS: 4.0000 mg | ORAL_TABLET | Freq: Four times a day (QID) | ORAL | Status: DC | PRN

## 2015-08-20 MED ORDER — LATANOPROST 0.005 % OP SOLN
1.0000 [drp] | Freq: Every day | OPHTHALMIC | Status: DC
Start: 2015-08-20 — End: 2015-08-23
  Administered 2015-08-20 – 2015-08-22 (×3): 1 [drp] via OPHTHALMIC
  Filled 2015-08-20: qty 2.5

## 2015-08-20 MED ORDER — ACETAMINOPHEN 650 MG RE SUPP: 650.0000 mg | Freq: Four times a day (QID) | RECTAL | Status: DC | PRN

## 2015-08-20 MED ORDER — POLYVINYL ALCOHOL 1.4 % OP SOLN
2.0000 [drp] | Freq: Two times a day (BID) | OPHTHALMIC | Status: DC
  Administered 2015-08-20 – 2015-08-23 (×6): 2 [drp] via OPHTHALMIC
  Filled 2015-08-20: qty 15

## 2015-08-20 MED ORDER — ACETAMINOPHEN 325 MG PO TABS: 650.0000 mg | ORAL_TABLET | Freq: Four times a day (QID) | ORAL | Status: DC | PRN

## 2015-08-20 MED ORDER — ASPIRIN 81 MG PO CHEW
81.0000 mg | CHEWABLE_TABLET | Freq: Every day | ORAL | Status: DC
  Administered 2015-08-20 – 2015-08-23 (×4): 81 mg via ORAL
  Filled 2015-08-20 (×4): qty 1

## 2015-08-20 MED ORDER — CARBOXYMETHYLCELLULOSE SODIUM 1 % OP SOLN: 2.0000 [drp] | Freq: Two times a day (BID) | OPHTHALMIC | Status: DC

## 2015-08-20 MED ORDER — ONDANSETRON HCL 4 MG/2ML IJ SOLN: 4.0000 mg | Freq: Four times a day (QID) | INTRAMUSCULAR | Status: DC | PRN

## 2015-08-20 MED ORDER — IRBESARTAN 150 MG PO TABS
75.0000 mg | ORAL_TABLET | Freq: Every day | ORAL | Status: DC
  Administered 2015-08-20 – 2015-08-23 (×4): 75 mg via ORAL
  Filled 2015-08-20 (×4): qty 1

## 2015-08-20 MED ORDER — ADULT MULTIVITAMIN W/MINERALS CH
1.0000 | ORAL_TABLET | Freq: Every day | ORAL | Status: DC
  Administered 2015-08-20 – 2015-08-23 (×4): 1 via ORAL
  Filled 2015-08-20 (×4): qty 1

## 2015-08-20 MED ORDER — VITAMIN D 1000 UNITS PO TABS
2000.0000 [IU] | ORAL_TABLET | Freq: Every day | ORAL | Status: DC
  Administered 2015-08-20 – 2015-08-23 (×4): 2000 [IU] via ORAL
  Filled 2015-08-20 (×4): qty 2

## 2015-08-20 MED ORDER — METRONIDAZOLE IN NACL 5-0.79 MG/ML-% IV SOLN
500.0000 mg | Freq: Three times a day (TID) | INTRAVENOUS | Status: DC
  Administered 2015-08-20 – 2015-08-22 (×6): 500 mg via INTRAVENOUS
  Filled 2015-08-20 (×8): qty 100

## 2015-08-20 MED ORDER — METRONIDAZOLE IN NACL 5-0.79 MG/ML-% IV SOLN
500.0000 mg | Freq: Once | INTRAVENOUS | Status: AC
  Administered 2015-08-20: 500 mg via INTRAVENOUS
  Filled 2015-08-20: qty 100

## 2015-08-20 MED ORDER — BRIMONIDINE TARTRATE 0.2 % OP SOLN
1.0000 [drp] | Freq: Two times a day (BID) | OPHTHALMIC | Status: DC
  Administered 2015-08-20 – 2015-08-23 (×6): 1 [drp] via OPHTHALMIC
  Filled 2015-08-20: qty 5

## 2015-08-20 MED ORDER — SODIUM CHLORIDE 0.9 % IV SOLN
INTRAVENOUS | Status: DC
  Administered 2015-08-21 – 2015-08-22 (×3): via INTRAVENOUS

## 2015-08-20 MED ORDER — SODIUM CHLORIDE 0.9 % IV BOLUS (SEPSIS)
1000.0000 mL | Freq: Once | INTRAVENOUS | Status: AC
  Administered 2015-08-20: 1000 mL via INTRAVENOUS

## 2015-08-20 MED ORDER — ENOXAPARIN SODIUM 30 MG/0.3ML ~~LOC~~ SOLN
30.0000 mg | SUBCUTANEOUS | Status: DC
  Administered 2015-08-20 – 2015-08-22 (×3): 30 mg via SUBCUTANEOUS
  Filled 2015-08-20 (×3): qty 0.3

## 2015-08-20 NOTE — ED Notes (Signed)
MD at bedside. 

## 2015-08-20 NOTE — ED Provider Notes (Signed)
[facility] Emergency Department Provider Note  Time seen: 3:25 PM  I have reviewed the triage vital signs and the nursing notes.   HISTORY  Chief Complaint Fatigue    HPI Adrienne Humphrey is a 82 y.o. female with a past medical history of hyperlipidemia, dementia, presents the emergency department with fatigue. According to the husband for the last 24-36 hours she has been very somnolent, sleeping most the day. Eating very little, taking in very little fluids. Nurse states "low-grade" fever at the nursing facility. Husband states the patient has had intermittent bouts of diarrhea over the past 2-3 days. Also occasional cough but no sputum production. Patient states she does not believe she has been coughing. Denies sore throat, congestion, nausea, vomiting, abdominal pain, chest pain.    Past Medical History  Diagnosis Date  . Hyperlipidemia   . Dementia   . Macular degeneration   . Osteoporosis     There are no active problems to display for this patient.   Past Surgical History  Procedure Laterality Date  . Abdominal surgery      mass removed in 80s  . Hip surgery      Current Outpatient Rx  Name  Route  Sig  Dispense  Refill  . aspirin 81 MG chewable tablet   Oral   Chew 81 mg by mouth daily.         . Cholecalciferol (VITAMIN D) 2000 UNITS CAPS   Oral   Take 2,000 Units by mouth daily.         . clopidogrel (PLAVIX) 75 MG tablet   Oral   Take 75 mg by mouth daily.         Marland Kitchen docusate calcium (SURFAK) 240 MG capsule   Oral   Take 240 mg by mouth at bedtime.         . ENSURE (ENSURE)   Oral   Take 237 mLs by mouth 3 (three) times daily between meals.         . mirtazapine (REMERON) 15 MG tablet   Oral   Take 7.5 mg by mouth at bedtime.         . Multiple Vitamin (MULTIVITAMIN WITH MINERALS) TABS tablet   Oral   Take 1 tablet by mouth daily.         . pantoprazole (PROTONIX) 40 MG tablet   Oral   Take 40 mg by  mouth daily.         . valsartan (DIOVAN) 80 MG tablet   Oral   Take 80 mg by mouth daily.         . vitamin C (ASCORBIC ACID) 500 MG tablet   Oral   Take 500 mg by mouth 2 (two) times daily.           Allergies Ace inhibitors and Sulfa antibiotics  History reviewed. No pertinent family history.  Social History Social History  Substance Use Topics  . Smoking status: Former Games developer  . Smokeless tobacco: None  . Alcohol Use: No    Review of Systems Constitutional: Possible low-grade fever at nursing home. Afebrile in the ED. Cardiovascular: Negative for chest pain. Respiratory: Negative for shortness of breath. Occasional cough per husband. Gastrointestinal: Negative for abdominal pain, vomiting. Intermittent episodes of diarrhea. Genitourinary: Negative for dysuria. Neurological: Negative for headache 10-point ROS otherwise negative.  ____________________________________________   PHYSICAL EXAM:  VITAL SIGNS: ED Triage Vitals  Enc Vitals Group     BP 08/20/15 1308 145/72  mmHg     Pulse Rate 08/20/15 1308 85     Resp 08/20/15 1308 22     Temp 08/20/15 1310 97.5 F (36.4 C)     Temp Source 08/20/15 1310 Oral     SpO2 08/20/15 1308 95 %     Weight 08/20/15 1308 123 lb (55.792 kg)     Height 08/20/15 1308  (1.651 m)     Head Cir --      Peak Flow --      Pain Score --      Pain Loc --      Pain Edu? --      Excl. in GC? --     Constitutional: Alert and oriented. Well appearing and in no distress. Eyes: Normal exam ENT   Head: Normocephalic and atraumatic.   Mouth/Throat: Mucous membranes are moist. Cardiovascular: Normal rate, regular rhythm. No murmur Respiratory: Normal respiratory effort without tachypnea nor retractions. Breath sounds are clear and equal bilaterally. No wheezes/rales/rhonchi. Gastrointestinal: Soft and nontender. No distention.  Musculoskeletal: Nontender with normal range of motion in all extremities. Neurologic:   Normal speech and language. No gross focal neurologic deficits  Skin:  Skin is warm, dry and intact.  Psychiatric: Mood and affect are normal. Speech and behavior are normal.   ____________________________________________    EKG  EKG reviewed and interpreted by myself shows normal sinus rhythm at 82 bpm, narrow QRS, normal axis, largely normal intervals. Nonspecific ST changes present with occasional PVC.  ____________________________________________    RADIOLOGY  Chest x-ray shows no acute abnormality  ____________________________________________   INITIAL IMPRESSION / ASSESSMENT AND PLAN / ED COURSE  Pertinent labs & imaging results that were available during my care of the patient were reviewed by me and considered in my medical decision making (see chart for details).  Patient presents the emergency department with fatigue. Husband states an occasional episode of diarrhea over the past 2-3 days, occasional cough up a past day. Patient has an overall well exam. Clear lung sounds bilaterally, nontender abdomen to palpation. Labs show a significant leukocytosis of 20,000, slight troponin elevation of 0.04. Currently awaiting urinalysis and chest x-ray results. I have also added on a hepatic function panel and lipase. We will send stool studies including C. difficile and placed the patient on enteric precautions until test results.  Patient has now had several bowel movements in the emergency department. C. difficile test has resulted positive. We'll start the patient on Flagyl and admitted to the hospital.  ____________________________________________   FINAL CLINICAL IMPRESSION(S) / ED DIAGNOSES  fatigue Clostridium difficile infection  Minna Antis, MD 08/20/15 1658

## 2015-08-20 NOTE — ED Notes (Signed)
Pt from [facility] with husband.  Sent bc nurse was concerned because patient has been tired and sleeping all morning.  Low grade temp at assisted living per ems but WNL with EMS.  Has had decreased PO intake per husband.

## 2015-08-20 NOTE — ED Notes (Addendum)
Pt had diarrhea X 1, changed but urine sample contaminated with feces.   Pt decreased appetite in the last few days.

## 2015-08-20 NOTE — H&P (Signed)
[facility] [place] at [facility]   PATIENT NAME: Adrienne Humphrey    MR#:  [id number]  DATE OF BIRTH:  [birth year]  DATE OF ADMISSION:  08/20/2015  PRIMARY CARE PHYSICIAN: No PCP Per Patient   REQUESTING/REFERRING PHYSICIAN: Dr Pershing Proud  CHIEF COMPLAINT:   Increasing weakness and diarrhea for 2-3 days HISTORY OF PRESENT ILLNESS:  Adrienne Humphrey  is a 82 y.o. female with a known history of hyperlipidemia, dementia, macular degeneration, osteoporosis, osteoarthritis who uses a walker occasionally and most of the time in the wheelchair at [facility]  independent living comes to the emergency room with increasing weakness, fatigability, somnolence with increased hours of sleeping and bouts of diarrhea for last 2-3 days. Patient has had poor by mouth intake appears clinically dehydrated. In the emergency room she is hemodynamically stable however were appears clinically dehydrated and was found to have C. difficile colitis. She has white count of 20,000. She is being admitted for further nausea management. She received first dose of IV Flagyl in the ER.  PAST MEDICAL HISTORY:   Past Medical History  Diagnosis Date  . Hyperlipidemia   . Dementia   . Macular degeneration   . Osteoporosis     PAST SURGICAL HISTOIRY:   Past Surgical History  Procedure Laterality Date  . Abdominal surgery      mass removed in 80s  . Hip surgery      SOCIAL HISTORY:   Social History  Substance Use Topics  . Smoking status: Former Games developer  . Smokeless tobacco: Not on file  . Alcohol Use: No    FAMILY HISTORY:  History reviewed. No pertinent family history.  DRUG ALLERGIES:   Allergies  Allergen Reactions  . Ace Inhibitors Other (See Comments)    Reaction:  Unknown   . Sulfa Antibiotics Other (See Comments)    Reaction:  Unknown     REVIEW OF SYSTEMS:  Review of Systems  Constitutional: Positive for malaise/fatigue. Negative for fever, chills and diaphoresis.   HENT: Negative for congestion, ear pain, hearing loss, nosebleeds and sore throat.   Eyes: Negative for blurred vision, double vision, photophobia and pain.  Respiratory: Negative for hemoptysis, sputum production, wheezing and stridor.   Cardiovascular: Negative for orthopnea, claudication and leg swelling.  Gastrointestinal: Positive for diarrhea. Negative for heartburn and abdominal pain.  Genitourinary: Negative for dysuria and frequency.  Musculoskeletal: Positive for back pain, joint pain and falls. Negative for neck pain.  Skin: Negative for rash.  Neurological: Positive for weakness. Negative for tingling, sensory change, speech change, focal weakness, seizures and headaches.  Endo/Heme/Allergies: Does not bruise/bleed easily.  Psychiatric/Behavioral: Negative for suicidal ideas, memory loss and substance abuse. The patient is not nervous/anxious.   All other systems reviewed and are negative.    MEDICATIONS AT HOME:   Prior to Admission medications   Medication Sig Start Date End Date Taking? Authorizing Provider  aspirin 81 MG chewable tablet Chew 81 mg by mouth daily.   Yes Historical Provider, MD  brimonidine (ALPHAGAN) 0.2 % ophthalmic solution Place 1 drop into both eyes 2 (two) times daily.   Yes Historical Provider, MD  carboxymethylcellulose 1 % ophthalmic solution Place 2 drops into both eyes 2 (two) times daily.   Yes Historical Provider, MD  Cholecalciferol (VITAMIN D) 2000 UNITS CAPS Take 2,000 Units by mouth daily.   Yes Historical Provider, MD  clopidogrel (PLAVIX) 75 MG tablet Take 75 mg by mouth daily.   Yes Historical Provider, MD  docusate  calcium (SURFAK) 240 MG capsule Take 240 mg by mouth at bedtime.   Yes Historical Provider, MD  ENSURE (ENSURE) Take 237 mLs by mouth 3 (three) times daily with meals.    Yes Historical Provider, MD  Eyelid Cleansers (OCUSOFT LID SCRUB EX) Apply 1 application topically daily.   Yes Historical Provider, MD  mirtazapine  (REMERON) 7.5 MG tablet Take 7.5 mg by mouth at bedtime.   Yes Historical Provider, MD  Multiple Vitamin (MULTIVITAMIN WITH MINERALS) TABS tablet Take 1 tablet by mouth daily.   Yes Historical Provider, MD  pantoprazole (PROTONIX) 40 MG tablet Take 40 mg by mouth daily.   Yes Historical Provider, MD  polyvinyl alcohol (LIQUIFILM TEARS) 1.4 % ophthalmic solution Place 1 drop into both eyes 4 (four) times daily as needed for dry eyes.   Yes Historical Provider, MD  Skin Protectants, Misc. (CALAZIME SKIN PROTECTANT) PSTE Apply 1 application topically as needed (with each diaper change).   Yes Historical Provider, MD  timolol (TIMOPTIC) 0.5 % ophthalmic solution Place 1 drop into both eyes daily.   Yes Historical Provider, MD  Travoprost, BAK Free, (TRAVATAN) 0.004 % SOLN ophthalmic solution Place 1 drop into both eyes at bedtime.   Yes Historical Provider, MD  valsartan (DIOVAN) 80 MG tablet Take 80 mg by mouth daily.   Yes Historical Provider, MD  vitamin C (ASCORBIC ACID) 500 MG tablet Take 500 mg by mouth 2 (two) times daily.   Yes Historical Provider, MD  White Petrolatum-Mineral Oil (ARTIFICIAL TEARS) OINT ophthalmic ointment Place 1 application into both eyes at bedtime.   Yes Historical Provider, MD      VITAL SIGNS:  Blood pressure 135/66, pulse 97, temperature 97.5 F (36.4 C), temperature source Oral, resp. rate 19, height  (1.651 m), weight 55.792 kg (123 lb), SpO2 97 %.  PHYSICAL EXAMINATION:  GENERAL:  82 y.o.-year-old patient lying in the bed with no acute distress. Pain cachectic EYES: Pupils equal, round, reactive to light and accommodation. No scleral icterus. Extraocular muscles intact.  HEENT: Head atraumatic, normocephalic. Oropharynx and nasopharynx clear. Dry oral mucosa NECK:  Supple, no jugular venous distention. No thyroid enlargement, no tenderness.  LUNGS: Normal breath sounds bilaterally, no wheezing, rales,rhonchi or crepitation. No use of accessory muscles of  respiration.  CARDIOVASCULAR: S1, S2 normal. No murmurs, rubs, or gallops.  ABDOMEN: Soft, nontender, nondistended. Bowel sounds present. No organomegaly or mass.  EXTREMITIES: No pedal edema, cyanosis, or clubbing.  NEUROLOGIC: Cranial nerves II through XII are intact. Muscle strength 4/5 in all extremities. Sensation intact. Gait not checked. Generalized weakness PSYCHIATRIC: patient is alert and oriented x 3.  SKIN: No obvious rash, lesion, or ulcer.   LABORATORY PANEL:   CBC  Recent Labs Lab 08/20/15 1317  WBC 20.1*  HGB 11.7*  HCT 36.8  PLT 390   ------------------------------------------------------------------------------------------------------------------  Chemistries   Recent Labs Lab 08/20/15 1317  NA 138  K 4.1  CL 107  CO2 22  GLUCOSE 103*  BUN 25*  CREATININE 1.09*  CALCIUM 8.8*  AST 14*  ALT 11*  ALKPHOS 84  BILITOT 0.7   ------------------------------------------------------------------------------------------------------------------  Cardiac Enzymes  Recent Labs Lab 08/20/15 1317  TROPONINI 0.04*   ------------------------------------------------------------------------------------------------------------------  RADIOLOGY:  Dg Chest Portable 1 View  08/20/2015  CLINICAL DATA:  Weakness.  Diarrhea.  Cough and fever. EXAM: PORTABLE CHEST - 1 VIEW COMPARISON:  One-view chest x-ray 07/03/2013 FINDINGS: The heart size is normal. Atherosclerotic calcifications are present at the aortic arch. Emphysema is noted.  The visualized soft tissues and bony thorax are unremarkable. IMPRESSION: 1. No acute cardiopulmonary disease. 2. Atherosclerosis. 3. Emphysema. Electronically Signed   By: Marin Roberts M.D.   On: 08/20/2015 15:46   IMPRESSION AND PLAN:  Diem Paone  is a 82 y.o. female with a known history of hyperlipidemia, dementia, macular degeneration, osteoporosis, osteoarthritis who uses a walker occasionally and most of the time in the  wheelchair at [facility]  independent living comes to the emergency room with increasing weakness, fatigability, somnolence with increased hours of sleeping and bouts of diarrhea for last 2-3 days.  1. C. difficile diarrhea with clinical dehydration and elevated white count of 20,000 Admit to medical floor Enteric isolation precautions IV fluids for hydration Follow-up white count count IV Flagyl Soft diet  2. Dehydration clinically IV fluids  3. History of macular degeneration continue eyedrops  4. Hypertension Resume home meds  5. DVT prophylaxis subcutaneous Lovenox   All the records are reviewed and case discussed with ED provider. Management plans discussed with the patient, family (husband) and they are in agreement.  CODE STATUS: DO NOT RESUSCITATE this was discussed with patient's husband was present in the ER  TOTAL TIME TAKING CARE OF THIS PATIENT: 45 minutes.    Alyzae Hawkey M.D on 08/20/2015 at 5:55 PM  Between 7am to 6pm - Pager - [phone]  After 6pm go to [email or web address] - password [email or web address]  [place] [place] [facility]  Office  [phone]  CC: Primary care physician; No PCP Per Patient

## 2015-08-21 DIAGNOSIS — A047 Enterocolitis due to Clostridium difficile: Secondary | ICD-10-CM | POA: Diagnosis not present

## 2015-08-21 LAB — CBC
HEMATOCRIT: 30.1 % — AB (ref 35.0–47.0)
HEMOGLOBIN: 9.8 g/dL — AB (ref 12.0–16.0)
MCH: 28.9 pg (ref 26.0–34.0)
MCHC: 32.5 g/dL (ref 32.0–36.0)
MCV: 88.8 fL (ref 80.0–100.0)
Platelets: 302 10*3/uL (ref 150–440)
RBC: 3.39 MIL/uL — AB (ref 3.80–5.20)
RDW: 13.9 % (ref 11.5–14.5)
WBC: 15.7 10*3/uL — AB (ref 3.6–11.0)

## 2015-08-21 LAB — BASIC METABOLIC PANEL
Anion gap: 9 (ref 5–15)
BUN: 25 mg/dL — ABNORMAL HIGH (ref 6–20)
CHLORIDE: 108 mmol/L (ref 101–111)
CO2: 23 mmol/L (ref 22–32)
Calcium: 8.3 mg/dL — ABNORMAL LOW (ref 8.9–10.3)
Creatinine, Ser: 1.11 mg/dL — ABNORMAL HIGH (ref 0.44–1.00)
GFR calc non Af Amer: 42 mL/min — ABNORMAL LOW (ref >60)
GFR, EST AFRICAN AMERICAN: 48 mL/min — AB (ref >60)
Glucose, Bld: 80 mg/dL (ref 65–99)
POTASSIUM: 3.4 mmol/L — AB (ref 3.5–5.1)
SODIUM: 140 mmol/L (ref 135–145)

## 2015-08-21 LAB — MRSA PCR SCREENING: MRSA by PCR: NEGATIVE

## 2015-08-21 MED ORDER — ENSURE ENLIVE PO LIQD
237.0000 mL | Freq: Two times a day (BID) | ORAL | Status: DC
  Administered 2015-08-21 – 2015-08-23 (×3): 237 mL via ORAL

## 2015-08-21 MED ORDER — MEGESTROL ACETATE 400 MG/10ML PO SUSP
400.0000 mg | Freq: Two times a day (BID) | ORAL | Status: DC
  Administered 2015-08-21 – 2015-08-23 (×5): 400 mg via ORAL
  Filled 2015-08-21 (×5): qty 10

## 2015-08-21 NOTE — Evaluation (Signed)
Physical Therapy Evaluation Patient Details Name: Adrienne Humphrey MRN: [id number] DOB: 1921/03/28 Today's Date: 08/21/2015   History of Present Illness  Pt admitted for Cdiff with complaints of weakness/diarrhea. Pt with history of hyperlipidemia, dementia, and OA. Pt currently lives at ILF.  Clinical Impression  Pt is a pleasant 82 year old female who was admitted for complaints of weakness. Pt performs bed mobility with mod assist, transfers with min assist, and ambulation with mod assist and rw. Pt demonstrates deficits with strength/mobility/endurance. Would benefit from skilled PT to address above deficits and promote optimal return to PLOF. Recommend transition to STR upon discharge from acute hospitalization.       Follow Up Recommendations SNF    Equipment Recommendations       Recommendations for Other Services       Precautions / Restrictions Precautions Precautions: Fall Restrictions Weight Bearing Restrictions: No      Mobility  Bed Mobility Overal bed mobility: Needs Assistance Bed Mobility: Supine to Sit     Supine to sit: Mod assist     General bed mobility comments: assist for scooting towards EOB as well as trunk support. Once seated at EOB, pt able to sit with cga  Transfers Overall transfer level: Needs assistance Equipment used: Rolling walker (2 wheeled) Transfers: Sit to/from Stand Sit to Stand: Min assist         General transfer comment: assist required for standing. Safe technique performed with cues for pushing from seated surface. Once standing, cues for upright posture.  Ambulation/Gait Ambulation/Gait assistance: Min assist Ambulation Distance (Feet): 3 Feet Assistive device: Rolling walker (2 wheeled) Gait Pattern/deviations: Step-to pattern     General Gait Details: ambulated to recliner with rw and assist for movement of AD as well as heavy cues for sequencing. Slow step to gait pattern performed. Pt able to follow commands  well  Stairs            Wheelchair Mobility    Modified Rankin (Stroke Patients Only)       Balance                                             Pertinent Vitals/Pain Pain Assessment: No/denies pain    Home Living Family/patient expects to be discharged to:: Assisted living               Home Equipment: Walker - 2 wheels;Wheelchair - manual      Prior Function Level of Independence: Independent with assistive device(s)               Hand Dominance        Extremity/Trunk Assessment   Upper Extremity Assessment: Generalized weakness (grossly 3/5)           Lower Extremity Assessment: Generalized weakness (grossly 4/5)         Communication   Communication: No difficulties  Cognition Arousal/Alertness: Awake/alert Behavior During Therapy: WFL for tasks assessed/performed Overall Cognitive Status: Within Functional Limits for tasks assessed                      General Comments      Exercises Other Exercises Other Exercises: Supine ther-ex performed including B LE ankle pumps, hip add squeezes, SLRs, and hip abd/add. All ther-ex performed x 10 reps with min assist and cues for technique  Assessment/Plan    PT Assessment Patient needs continued PT services  PT Diagnosis Difficulty walking;Generalized weakness   PT Problem List Decreased strength;Decreased balance;Decreased mobility;Decreased coordination  PT Treatment Interventions Gait training;Therapeutic exercise   PT Goals (Current goals can be found in the Care Plan section) Acute Rehab PT Goals Patient Stated Goal: to get stronger PT Goal Formulation: With patient Time For Goal Achievement: 09/04/15 Potential to Achieve Goals: Good    Frequency Min 2X/week   Barriers to discharge        Co-evaluation               End of Session Equipment Utilized During Treatment: Gait belt Activity Tolerance: Patient tolerated treatment  well Patient left: in chair;with chair alarm set Nurse Communication: Mobility status         Time: 0454-0981 PT Time Calculation (min) (ACUTE ONLY): 28 min   Charges:   PT Evaluation $PT Eval Moderate Complexity: 1 Procedure PT Treatments $Therapeutic Exercise: 8-22 mins   PT G Codes:        Roben Tatsch September 11, 2015, 3:30 PM  Elizabeth Palau, PT, DPT [phone]

## 2015-08-21 NOTE — Progress Notes (Signed)
[facility] [place] at [facility]   PATIENT NAME: Adrienne Humphrey    MR#:  [id number]  DATE OF BIRTH:  1920/11/08  SUBJECTIVE:   Poor appetite. Grand-dter in the room. REVIEW OF SYSTEMS:   Review of Systems  Constitutional: Negative for fever, chills and weight loss.  HENT: Negative for ear discharge, ear pain and nosebleeds.   Eyes: Negative for blurred vision, pain and discharge.  Respiratory: Negative for sputum production, shortness of breath, wheezing and stridor.   Cardiovascular: Negative for chest pain, palpitations, orthopnea and PND.  Gastrointestinal: Negative for nausea, vomiting, abdominal pain and diarrhea.  Genitourinary: Negative for urgency and frequency.  Musculoskeletal: Negative for back pain and joint pain.  Neurological: Positive for weakness. Negative for sensory change, speech change and focal weakness.  Psychiatric/Behavioral: Negative for depression and hallucinations. The patient is not nervous/anxious.    Tolerating Diet:poor appetite Tolerating PT: rec snf  DRUG ALLERGIES:   Allergies  Allergen Reactions  . Ace Inhibitors Other (See Comments)    Reaction:  Unknown   . Sulfa Antibiotics Other (See Comments)    Reaction:  Unknown     VITALS:  Blood pressure 141/66, pulse 71, temperature 98 F (36.7 C), temperature source Oral, resp. rate 18, height  (1.651 m), weight 55.792 kg (123 lb), SpO2 95 %.  PHYSICAL EXAMINATION:   Physical Exam  GENERAL:  82 y.o.-year-old patient lying in the bed with no acute distress. Thin cachectic EYES: Pupils equal, round, reactive to light and accommodation. No scleral icterus. Extraocular muscles intact.  HEENT: Head atraumatic, normocephalic. Oropharynx and nasopharynx clear.  NECK:  Supple, no jugular venous distention. No thyroid enlargement, no tenderness.  LUNGS: Normal breath sounds bilaterally, no wheezing, rales, rhonchi. No use of accessory muscles of respiration.   CARDIOVASCULAR: S1, S2 normal. No murmurs, rubs, or gallops.  ABDOMEN: Soft, nontender, nondistended. Bowel sounds present. No organomegaly or mass.  EXTREMITIES: No cyanosis, clubbing or edema b/l.    NEUROLOGIC: Cranial nerves II through XII are intact. No focal Motor or sensory deficits b/l.   PSYCHIATRIC: The patient is alert and oriented x 3.  SKIN: No obvious rash, lesion, or ulcer.   LABORATORY PANEL:  CBC  Recent Labs Lab 08/21/15 0301  WBC 15.7*  HGB 9.8*  HCT 30.1*  PLT 302    Chemistries   Recent Labs Lab 08/20/15 1317 08/21/15 0301  NA 138 140  K 4.1 3.4*  CL 107 108  CO2 22 23  GLUCOSE 103* 80  BUN 25* 25*  CREATININE 1.09* 1.11*  CALCIUM 8.8* 8.3*  AST 14*  --   ALT 11*  --   ALKPHOS 84  --   BILITOT 0.7  --     Cardiac Enzymes  Recent Labs Lab 08/20/15 1317  TROPONINI 0.04*   RADIOLOGY:  Dg Chest Portable 1 View  08/20/2015  CLINICAL DATA:  Weakness.  Diarrhea.  Cough and fever. EXAM: PORTABLE CHEST - 1 VIEW COMPARISON:  One-view chest x-ray 07/03/2013 FINDINGS: The heart size is normal. Atherosclerotic calcifications are present at the aortic arch. Emphysema is noted. The visualized soft tissues and bony thorax are unremarkable. IMPRESSION: 1. No acute cardiopulmonary disease. 2. Atherosclerosis. 3. Emphysema. Electronically Signed   By: Marin Roberts M.D.   On: 08/20/2015 15:46   ASSESSMENT AND PLAN:  Darika Roskos is a 82 y.o. female with a known history of hyperlipidemia, dementia, macular degeneration, osteoporosis, osteoarthritis who uses a walker occasionally and most of the time  in the wheelchair at [facility] independent living comes to the emergency room with increasing weakness, fatigability, somnolence with increased hours of sleeping and bouts of diarrhea for last 2-3 days.  1. C. difficile diarrhea with clinical dehydration and elevated white count of 20,000 Enteric isolation precautions IV fluids for hydration Follow-up  white count count improving down to 15K IV Flagyl Soft diet  2. Dehydration clinically IV fluids  3. History of macular degeneration continue eyedrops  4. Hypertension Resume home meds  5. DVT prophylaxis subcutaneous Lovenox  6.severe malnutiriton -dietitian to see Ensure ordered  PT rec SNF CSW for d/c planning  Case discussed with Care Management/Social Worker. Management plans discussed with the patient, family and they are in agreement.  CODE STATUS: DNR  DVT Prophylaxis: lovenox  TOTAL TIME TAKING CARE OF THIS PATIENT: 30 minutes.  >50% time spent on counselling and coordination of care grand dter  POSSIBLE D/C IN 1-2 DAYS, DEPENDING ON CLINICAL CONDITION.  Note: This dictation was prepared with Dragon dictation along with smaller phrase technology. Any transcriptional errors that result from this process are unintentional.  Darlinda Bellows M.D on 08/21/2015 at 3:49 PM  Between 7am to 6pm - Pager - [phone]  After 6pm go to [email or web address] - password [email or web address]  [place] [facility]  Office  [phone]  CC: Primary care physician; No PCP Per Patient

## 2015-08-22 DIAGNOSIS — A047 Enterocolitis due to Clostridium difficile: Secondary | ICD-10-CM | POA: Diagnosis not present

## 2015-08-22 MED ORDER — METRONIDAZOLE 500 MG PO TABS
500.0000 mg | ORAL_TABLET | Freq: Three times a day (TID) | ORAL | Status: DC
  Administered 2015-08-22 – 2015-08-23 (×3): 500 mg via ORAL
  Filled 2015-08-22 (×3): qty 1

## 2015-08-22 MED ORDER — CLOPIDOGREL BISULFATE 75 MG PO TABS
75.0000 mg | ORAL_TABLET | Freq: Every day | ORAL | Status: DC
  Administered 2015-08-23: 75 mg via ORAL
  Filled 2015-08-22: qty 1

## 2015-08-22 NOTE — [place] FL2 (Signed)
[place] MEDICAID FL2 LEVEL OF CARE SCREENING TOOL     IDENTIFICATION  Patient Name: Adrienne Humphrey Birthdate: 26-Mar-1921 Sex: female Admission Date (Current Location): 08/20/2015  [place] and [place] Number:  Chiropodist and Address:  [facility], [address] [place], [place] [address]      Provider Number: [id number]  Attending Physician Name and Address:  Enedina Finner, MD  Relative Name and Phone Number:       Current Level of Care: Hospital Recommended Level of Care: Assisted Living Facility Prior Approval Number:    Date Approved/Denied:   PASRR Number:    Discharge Plan: Domiciliary (Rest home)    Current Diagnoses: Patient Active Problem List   Diagnosis Date Noted  . C. difficile colitis 08/20/2015    Orientation RESPIRATION BLADDER Height & Weight    Self  Normal Incontinent  (165.1 cm) 123 lbs.  BEHAVIORAL SYMPTOMS/MOOD NEUROLOGICAL BOWEL NUTRITION STATUS      Incontinent Diet (soft)  AMBULATORY STATUS COMMUNICATION OF NEEDS Skin   Extensive Assist Verbally Normal                       Personal Care Assistance Level of Assistance  Bathing, Feeding, Dressing Bathing Assistance: Limited assistance Feeding assistance: Limited assistance Dressing Assistance: Limited assistance     Functional Limitations Info  Sight, Hearing, Speech Sight Info: Adequate Hearing Info: Impaired Speech Info: Adequate    SPECIAL CARE FACTORS FREQUENCY  PT (By licensed PT) (2/3 times a week)                    Contractures      Additional Factors Info  Allergies   Allergies Info:  (Ace Inhibitors, Sulfa Antibiotics)           Current Medications (08/22/2015):  This is the current hospital active medication list Current Facility-Administered Medications  Medication Dose Route Frequency Provider Last Rate Last Dose  . 0.9 %  sodium chloride infusion   Intravenous Continuous Enedina Finner, MD 75 mL/hr at 08/22/15  0521    . acetaminophen (TYLENOL) tablet 650 mg  650 mg Oral Q6H PRN Enedina Finner, MD       Or  . acetaminophen (TYLENOL) suppository 650 mg  650 mg Rectal Q6H PRN Enedina Finner, MD      . aspirin chewable tablet 81 mg  81 mg Oral Daily Enedina Finner, MD   81 mg at 08/22/15 0856  . brimonidine (ALPHAGAN) 0.2 % ophthalmic solution 1 drop  1 drop Both Eyes BID Enedina Finner, MD   1 drop at 08/22/15 0857  . cholecalciferol (VITAMIN D) tablet 2,000 Units  2,000 Units Oral Daily Enedina Finner, MD   2,000 Units at 08/22/15 0856  . clopidogrel (PLAVIX) tablet 75 mg  75 mg Oral Daily Enedina Finner, MD   75 mg at 08/22/15 0857  . enoxaparin (LOVENOX) injection 30 mg  30 mg Subcutaneous Q24H Enedina Finner, MD   30 mg at 08/21/15 1728  . feeding supplement (ENSURE ENLIVE) (ENSURE ENLIVE) liquid 237 mL  237 mL Oral BID BM Enedina Finner, MD   237 mL at 08/21/15 1631  . irbesartan (AVAPRO) tablet 75 mg  75 mg Oral Daily Enedina Finner, MD   75 mg at 08/22/15 0856  . latanoprost (XALATAN) 0.005 % ophthalmic solution 1 drop  1 drop Both Eyes QHS Enedina Finner, MD   1 drop at 08/21/15 2150  . megestrol (MEGACE) 400  MG/10ML suspension 400 mg  400 mg Oral BID Enedina Finner, MD   400 mg at 08/22/15 0859  . metroNIDAZOLE (FLAGYL) IVPB 500 mg  500 mg Intravenous Q8H Enedina Finner, MD   500 mg at 08/22/15 0855  . mirtazapine (REMERON) tablet 7.5 mg  7.5 mg Oral QHS Enedina Finner, MD   7.5 mg at 08/21/15 2149  . multivitamin with minerals tablet 1 tablet  1 tablet Oral Daily Enedina Finner, MD   1 tablet at 08/22/15 0857  . ondansetron (ZOFRAN) tablet 4 mg  4 mg Oral Q6H PRN Enedina Finner, MD       Or  . ondansetron (ZOFRAN) injection 4 mg  4 mg Intravenous Q6H PRN Enedina Finner, MD      . pantoprazole (PROTONIX) EC tablet 40 mg  40 mg Oral Daily Enedina Finner, MD   40 mg at 08/22/15 0855  . polyvinyl alcohol (LIQUIFILM TEARS) 1.4 % ophthalmic solution 1 drop  1 drop Both Eyes QID PRN Enedina Finner, MD   1 drop at 08/21/15 1019  . polyvinyl alcohol (LIQUIFILM TEARS) 1.4 %  ophthalmic solution 2 drop  2 drop Both Eyes BID Cindi Carbon, [facility]   2 drop at 08/22/15 0857  . timolol (TIMOPTIC) 0.5 % ophthalmic solution 1 drop  1 drop Both Eyes Daily Enedina Finner, MD   1 drop at 08/22/15 0902  . vitamin C (ASCORBIC ACID) tablet 500 mg  500 mg Oral BID Enedina Finner, MD   500 mg at 08/22/15 1191     Discharge Medications: Please see discharge summary for a list of discharge medications.  Relevant Imaging Results:  Relevant Lab Results:   Additional Information Home Health PT 2 to 3 times per week  ([id number])  Soundra Pilon, LCSW

## 2015-08-22 NOTE — Clinical Social Work Note (Signed)
Clinical Social Work Assessment  Patient Details  Name: Adrienne Humphrey MRN: [id number] Date of Birth: 02/02/1921  Date of referral:  08/22/15               Reason for consult:  Facility Placement, Other (Comment Required) (From [facility] )                Permission sought to share information with:  Facility Contact Representative Permission granted to share information::  Yes, Verbal Permission Granted  Name::      [facility]   Agency::     Relationship::     Contact Information:     Housing/Transportation Living arrangements for the past 2 months:  Assisted Living Facility Source of Information:  Spouse Patient Interpreter Needed:  None Criminal Activity/Legal Involvement Pertinent to Current Situation/Hospitalization:  No - Comment as needed Significant Relationships:  Spouse Lives with:  Facility Resident, Spouse Do you feel safe going back to the place where you live?  Yes Need for family participation in patient care:  Yes (Comment)  Care giving concerns:  Patient is from [facility].    Social Worker assessment / plan: Clinical Social Worker (CSW) received consult for SNF placement. PT is recommending SNF. CSW met with patient and her husband Craig Demarest was at bedside. CSW introduced self and explained role of CSW department. Patient was laying in bed and did not participate in assessment. Husband reported that patient and himself both live at [facility]. Per husband they have 2 separate rooms that join together. Husband reported that he assists patient with transfers to the wheel chair. Per husband patient was working with PT at [facility] with Cindy. Husband reported that patient has Medicare Part B only and [employer]. Per husband [employer] is primary insurance for the hospital. Husband reported that patient went to [facility] 2 years ago after hip surgery. Per husband patient was there for 4 weeks and he had to pay 2 weeks out of pocket. Husband reported that  he does not want patient to go to rehab. CSW explained that PT is recommending SNF. Husband reported that patient will return to [facility] with PT. Husband reported that [employer] is also paying for [facility]. Husband reported that he would like for [facility] to transport if possible.   FL2 complete. MD aware of above. CSW will continue to follow and assist as needed.     Employment status:  Disabled (Comment on whether or not currently receiving Disability), Retired Insurance information:  Medicare, Other (Comment Required) ([employer] ) PT Recommendations:  Skilled Nursing Facility Information / Referral to community resources:  Other (Comment Required) (Assisted Living Facility )  Patient/Family's Response to care:  Patient's husband is agreeable for patient to return to [facility].   Patient/Family's Understanding of and Emotional Response to Diagnosis, Current Treatment, and Prognosis: Husband was pleasant throughout assessment.   Emotional Assessment Appearance:  Appears stated age Attitude/Demeanor/Rapport:    Affect (typically observed):  Pleasant, Quiet Orientation:  Oriented to Self, Fluctuating Orientation (Suspected and/or reported Sundowners) Alcohol / Substance use:  Not Applicable Psych involvement (Current and /or in the community):  No (Comment)  Discharge Needs  Concerns to be addressed:  Discharge Planning Concerns Readmission within the last 30 days:  No Current discharge risk:  Cognitively Impaired Barriers to Discharge:  Continued Medical Work up   Morgan,  G, LCSW 08/22/2015, 12:05 PM  

## 2015-08-22 NOTE — Progress Notes (Signed)
[facility] - [place] at [facility]   PATIENT NAME: Adrienne Humphrey    MR#:  [id number]  DATE OF BIRTH:  1921-05-03  SUBJECTIVE:   Improving appetite. Husband in the room. No more diarrhea REVIEW OF SYSTEMS:   Review of Systems  Constitutional: Negative for fever, chills and weight loss.  HENT: Negative for ear discharge, ear pain and nosebleeds.   Eyes: Negative for blurred vision, pain and discharge.  Respiratory: Negative for sputum production, shortness of breath, wheezing and stridor.   Cardiovascular: Negative for chest pain, palpitations, orthopnea and PND.  Gastrointestinal: Negative for nausea, vomiting, abdominal pain and diarrhea.  Genitourinary: Negative for urgency and frequency.  Musculoskeletal: Negative for back pain and joint pain.  Neurological: Positive for weakness. Negative for sensory change, speech change and focal weakness.  Psychiatric/Behavioral: Negative for depression and hallucinations. The patient is not nervous/anxious.    Tolerating Diet:poor appetite Tolerating PT: rec snf  DRUG ALLERGIES:   Allergies  Allergen Reactions  . Ace Inhibitors Other (See Comments)    Reaction:  Unknown   . Sulfa Antibiotics Other (See Comments)    Reaction:  Unknown     VITALS:  Blood pressure 158/64, pulse 79, temperature 97.9 F (36.6 C), temperature source Oral, resp. rate 16, height  (1.651 m), weight 55.792 kg (123 lb), SpO2 97 %.  PHYSICAL EXAMINATION:   Physical Exam  GENERAL:  82 y.o.-year-old patient lying in the bed with no acute distress. Thin cachectic EYES: Pupils equal, round, reactive to light and accommodation. No scleral icterus. Extraocular muscles intact.  HEENT: Head atraumatic, normocephalic. Oropharynx and nasopharynx clear.  NECK:  Supple, no jugular venous distention. No thyroid enlargement, no tenderness.  LUNGS: Normal breath sounds bilaterally, no wheezing, rales, rhonchi. No use of accessory muscles of  respiration.  CARDIOVASCULAR: S1, S2 normal. No murmurs, rubs, or gallops.  ABDOMEN: Soft, nontender, nondistended. Bowel sounds present. No organomegaly or mass.  EXTREMITIES: No cyanosis, clubbing or edema b/l.    NEUROLOGIC: Cranial nerves II through XII are intact. No focal Motor or sensory deficits b/l.   PSYCHIATRIC: The patient is alert and oriented x 3.  SKIN: No obvious rash, lesion, or ulcer.   LABORATORY PANEL:  CBC  Recent Labs Lab 08/21/15 0301  WBC 15.7*  HGB 9.8*  HCT 30.1*  PLT 302    Chemistries   Recent Labs Lab 08/20/15 1317 08/21/15 0301  NA 138 140  K 4.1 3.4*  CL 107 108  CO2 22 23  GLUCOSE 103* 80  BUN 25* 25*  CREATININE 1.09* 1.11*  CALCIUM 8.8* 8.3*  AST 14*  --   ALT 11*  --   ALKPHOS 84  --   BILITOT 0.7  --     Cardiac Enzymes  Recent Labs Lab 08/20/15 1317  TROPONINI 0.04*   RADIOLOGY:  Dg Chest Portable 1 View  08/20/2015  CLINICAL DATA:  Weakness.  Diarrhea.  Cough and fever. EXAM: PORTABLE CHEST - 1 VIEW COMPARISON:  One-view chest x-ray 07/03/2013 FINDINGS: The heart size is normal. Atherosclerotic calcifications are present at the aortic arch. Emphysema is noted. The visualized soft tissues and bony thorax are unremarkable. IMPRESSION: 1. No acute cardiopulmonary disease. 2. Atherosclerosis. 3. Emphysema. Electronically Signed   By: Marin Roberts M.D.   On: 08/20/2015 15:46   ASSESSMENT AND PLAN:  Adrienne Humphrey is a 82 y.o. female with a known history of hyperlipidemia, dementia, macular degeneration, osteoporosis, osteoarthritis who uses a walker occasionally and most  of the time in the wheelchair at [facility] independent living comes to the emergency room with increasing weakness, fatigability, somnolence with increased hours of sleeping and bouts of diarrhea for last 2-3 days.  1. C. difficile diarrhea with clinical dehydration and elevated white count of 20,000 Enteric isolation precautions IV fluids for  hydration Follow-up white count count improving down to 15K IV Flagyl--change to by mouth Soft diet  2. Dehydration clinically DC IV fluids  3. History of macular degeneration continue eyedrops  4. Hypertension Resume home meds  5. DVT prophylaxis subcutaneous Lovenox  6.severe malnutiriton -dietitian to see Ensure ordered  PT rec SNF CSW for d/c planning Patient will discharge to her facility tomorrow with physical therapy.  Case discussed with Care Management/Social Worker. Management plans discussed with the patient, family and they are in agreement.  CODE STATUS: DNR  DVT Prophylaxis: lovenox  TOTAL TIME TAKING CARE OF THIS PATIENT: 30 minutes.  >50% time spent on counselling and coordination of care grand dter  POSSIBLE D/C IN 1-2 DAYS, DEPENDING ON CLINICAL CONDITION.  Note: This dictation was prepared with Dragon dictation along with smaller phrase technology. Any transcriptional errors that result from this process are unintentional.  Jhoan Schmieder M.D on 08/22/2015 at 12:35 PM  Between 7am to 6pm - Pager - [phone]  After 6pm go to [email or web address] - password [email or web address] [facility]  [facility]  Office  [phone]  CC: Primary care physician; No PCP Per Patient

## 2015-08-22 NOTE — Clinical Documentation Improvement (Signed)
Internal Medicine  Can you please document the significance of the elevated BUN/Creatinine and low GFR on admission?     Acute renal failure    Chronic kidney failure (please specify stage from below)    Other condition ___________  CKD Stage I - GFR greater than or equal to 90  CKD Stage II - GFR 60-89  CKD Stage III - GFR 30-59  CKD Stage IV - GFR 15-29  CKD Stage V - GFR < 15  ESRD (End Stage Renal Disease)   Supporting Information:   Component     Latest Ref Range 08/20/2015 08/21/2015  BUN     6 - 20 mg/dL 25 (H) 25 (H)  Creatinine     0.44 - 1.00 mg/dL 1.09 (H) 1.11 (H)  EGFR (Non-African Amer.)     >60 mL/min 43 (L) 42 (L)   Please update your documentation within the medical record to reflect your response to this query.   Please exercise your independent, professional judgment when responding. A specific answer is not anticipated or expected.   Thank You,  Posey Pronto, RN, BSN, [place] Clinical Documentation Improvement Specialist HIM department--[place] [phone]

## 2015-08-23 DIAGNOSIS — A047 Enterocolitis due to Clostridium difficile: Secondary | ICD-10-CM | POA: Diagnosis not present

## 2015-08-23 MED ORDER — METRONIDAZOLE 500 MG PO TABS: 500.0000 mg | ORAL_TABLET | Freq: Three times a day (TID) | ORAL | Status: DC

## 2015-08-23 MED ORDER — MEGESTROL ACETATE 400 MG/10ML PO SUSP: 400.0000 mg | Freq: Two times a day (BID) | ORAL | Status: AC

## 2015-08-23 NOTE — Care Management Important Message (Signed)
Important Message  Patient Details  Name: Adrienne Humphrey MRN: [id number] Date of Birth: 12/03/1920   Medicare Important Message Given:  Yes    Collie Siad, RN 08/23/2015, 9:56 AM

## 2015-08-23 NOTE — [place] FL2 (Signed)
[place] MEDICAID FL2 LEVEL OF CARE SCREENING TOOL     IDENTIFICATION  Patient Name: Adrienne Humphrey Birthdate: 05-Aug-1920 Sex: female Admission Date (Current Location): 08/20/2015  [place] and [place] Number:  Chiropodist and Address:  [facility], [address], [place], [place] [address]      Provider Number: [id number]  Attending Physician Name and Address:  Delfino Lovett, MD  Relative Name and Phone Number:       Current Level of Care: Hospital Recommended Level of Care: Assisted Living Facility Prior Approval Number:    Date Approved/Denied:   PASRR Number:    Discharge Plan: Domiciliary (Rest home)    Current Diagnoses: Patient Active Problem List   Diagnosis Date Noted  . C. difficile colitis 08/20/2015    Orientation RESPIRATION BLADDER Height & Weight    Self  Normal Incontinent  (165.1 cm) 123 lbs.  BEHAVIORAL SYMPTOMS/MOOD NEUROLOGICAL BOWEL NUTRITION STATUS      Incontinent Regular Diet   AMBULATORY STATUS COMMUNICATION OF NEEDS Skin   Extensive Assist Verbally Normal                       Personal Care Assistance Level of Assistance  Bathing, Feeding, Dressing Bathing Assistance: Limited assistance Feeding assistance: Limited assistance Dressing Assistance: Limited assistance     Functional Limitations Info  Sight, Hearing, Speech Sight Info: Adequate Hearing Info: Impaired Speech Info: Adequate    SPECIAL CARE FACTORS FREQUENCY  PT (By licensed PT) (2/3 times a week)                    Contractures      Additional Factors Info  Allergies   Allergies Info:  (Ace Inhibitors, Sulfa Antibiotics)             Discharge Medications: Please see discharge summary for a list of discharge medications. DISCHARGE MEDICATIONS:   Current Discharge Medication List    START taking these medications   Details  megestrol (MEGACE) 400 MG/10ML suspension Take 10 mLs (400 mg total) by  mouth 2 (two) times daily. Qty: 240 mL, Refills: 0    metroNIDAZOLE (FLAGYL) 500 MG tablet Take 1 tablet (500 mg total) by mouth 3 (three) times daily. Qty: 21 tablet, Refills: 0      CONTINUE these medications which have NOT CHANGED   Details  aspirin 81 MG chewable tablet Chew 81 mg by mouth daily.    brimonidine (ALPHAGAN) 0.2 % ophthalmic solution Place 1 drop into both eyes 2 (two) times daily.    carboxymethylcellulose 1 % ophthalmic solution Place 2 drops into both eyes 2 (two) times daily.    Cholecalciferol (VITAMIN D) 2000 UNITS CAPS Take 2,000 Units by mouth daily.    clopidogrel (PLAVIX) 75 MG tablet Take 75 mg by mouth daily.    Eyelid Cleansers (OCUSOFT LID SCRUB EX) Apply 1 application topically daily.    mirtazapine (REMERON) 7.5 MG tablet Take 7.5 mg by mouth at bedtime.    Multiple Vitamin (MULTIVITAMIN WITH MINERALS) TABS tablet Take 1 tablet by mouth daily.    pantoprazole (PROTONIX) 40 MG tablet Take 40 mg by mouth daily.    polyvinyl alcohol (LIQUIFILM TEARS) 1.4 % ophthalmic solution Place 1 drop into both eyes 4 (four) times daily as needed for dry eyes.    Skin Protectants, Misc. (CALAZIME SKIN PROTECTANT) PSTE Apply 1 application topically as needed (with each diaper change).    timolol (TIMOPTIC) 0.5 %  ophthalmic solution Place 1 drop into both eyes daily.    Travoprost, BAK Free, (TRAVATAN) 0.004 % SOLN ophthalmic solution Place 1 drop into both eyes at bedtime.    valsartan (DIOVAN) 80 MG tablet Take 80 mg by mouth daily.    vitamin C (ASCORBIC ACID) 500 MG tablet Take 500 mg by mouth 2 (two) times daily.    White Petrolatum-Mineral Oil (ARTIFICIAL TEARS) OINT ophthalmic ointment Place 1 application into both eyes at bedtime.      STOP taking these medications     docusate calcium (SURFAK) 240 MG capsule      ENSURE (ENSURE)         Relevant Imaging Results:  Relevant  Lab Results:   Additional Information  ([id number])  Haig Prophet, LCSW

## 2015-08-23 NOTE — Care Management (Signed)
Patient back to [facility] today. Referral called to [facility]. I have messaged DR. Sherryll Burger for home health orders. No further RNCM needs. Case closed.

## 2015-08-23 NOTE — Discharge Summary (Signed)
[facility] - [place] at [facility]   PATIENT NAME: Adrienne Humphrey    MR#:  [id number]  DATE OF BIRTH:  [birth year]  DATE OF ADMISSION:  08/20/2015 ADMITTING PHYSICIAN: Enedina Finner, MD  DATE OF DISCHARGE: 08/23/2015  PRIMARY CARE PHYSICIAN: Darcel Smalling, MD   ADMISSION DIAGNOSIS:  Colitis, Clostridium difficile [A04.7]  DISCHARGE DIAGNOSIS:  Active Problems:   C. difficile colitis  SECONDARY DIAGNOSIS:   Past Medical History  Diagnosis Date  . Hyperlipidemia   . Dementia   . Macular degeneration   . Osteoporosis     HOSPITAL COURSE:  82 y.o. female with a known history of hyperlipidemia, dementia, macular degeneration, osteoporosis, osteoarthritis who uses a walker occasionally and most of the time in the wheelchair at [facility] independent living admitted with increasing weakness, fatigability, somnolence with increased hours of sleeping and bouts of diarrhea for last 2-3 days.  See Dr. Eliane Decree dictated history and physical for further details.  Patient was found to have C. difficile colitis.  She was treated with IV Flagyl while in the hospital as she was having a poor by mouth intake and was having difficulty taking oral intake.  Her diarrhea has improved with Flagyl, she is able to keep oral, down and tolerating her diet.  She is being discharged on oral Flagyl in stable condition. She is agreeable with the discharge plans.  DISCHARGE CONDITIONS:   Stable  CONSULTS OBTAINED:    None DRUG ALLERGIES:   Allergies  Allergen Reactions  . Ace Inhibitors Other (See Comments)    Reaction:  Unknown   . Sulfa Antibiotics Other (See Comments)    Reaction:  Unknown     DISCHARGE MEDICATIONS:   Current Discharge Medication List    START taking these medications   Details  megestrol (MEGACE) 400 MG/10ML suspension Take 10 mLs (400 mg total) by mouth 2 (two) times daily. Qty: 240 mL, Refills: 0    metroNIDAZOLE (FLAGYL) 500 MG tablet  Take 1 tablet (500 mg total) by mouth 3 (three) times daily. Qty: 21 tablet, Refills: 0      CONTINUE these medications which have NOT CHANGED   Details  aspirin 81 MG chewable tablet Chew 81 mg by mouth daily.    brimonidine (ALPHAGAN) 0.2 % ophthalmic solution Place 1 drop into both eyes 2 (two) times daily.    carboxymethylcellulose 1 % ophthalmic solution Place 2 drops into both eyes 2 (two) times daily.    Cholecalciferol (VITAMIN D) 2000 UNITS CAPS Take 2,000 Units by mouth daily.    clopidogrel (PLAVIX) 75 MG tablet Take 75 mg by mouth daily.    Eyelid Cleansers (OCUSOFT LID SCRUB EX) Apply 1 application topically daily.    mirtazapine (REMERON) 7.5 MG tablet Take 7.5 mg by mouth at bedtime.    Multiple Vitamin (MULTIVITAMIN WITH MINERALS) TABS tablet Take 1 tablet by mouth daily.    pantoprazole (PROTONIX) 40 MG tablet Take 40 mg by mouth daily.    polyvinyl alcohol (LIQUIFILM TEARS) 1.4 % ophthalmic solution Place 1 drop into both eyes 4 (four) times daily as needed for dry eyes.    Skin Protectants, Misc. (CALAZIME SKIN PROTECTANT) PSTE Apply 1 application topically as needed (with each diaper change).    timolol (TIMOPTIC) 0.5 % ophthalmic solution Place 1 drop into both eyes daily.    Travoprost, BAK Free, (TRAVATAN) 0.004 % SOLN ophthalmic solution Place 1 drop into both eyes at bedtime.    valsartan (DIOVAN) 80 MG tablet  Take 80 mg by mouth daily.    vitamin C (ASCORBIC ACID) 500 MG tablet Take 500 mg by mouth 2 (two) times daily.    White Petrolatum-Mineral Oil (ARTIFICIAL TEARS) OINT ophthalmic ointment Place 1 application into both eyes at bedtime.      STOP taking these medications     docusate calcium (SURFAK) 240 MG capsule      ENSURE (ENSURE)          DISCHARGE INSTRUCTIONS:    DIET:  Regular diet  DISCHARGE CONDITION:  Good  ACTIVITY:  Activity as tolerated  OXYGEN:  Home Oxygen: No.   Oxygen Delivery: room air  DISCHARGE  LOCATION:  nursing home   If you experience worsening of your admission symptoms, develop shortness of breath, life threatening emergency, suicidal or homicidal thoughts you must seek medical attention immediately by calling 911 or calling your MD immediately  if symptoms less severe.  You Must read complete instructions/literature along with all the possible adverse reactions/side effects for all the Medicines you take and that have been prescribed to you. Take any new Medicines after you have completely understood and accpet all the possible adverse reactions/side effects.   Please note  You were cared for by a hospitalist during your hospital stay. If you have any questions about your discharge medications or the care you received while you were in the hospital after you are discharged, you can call the unit and asked to speak with the hospitalist on call if the hospitalist that took care of you is not available. Once you are discharged, your primary care physician will handle any further medical issues. Please note that NO REFILLS for any discharge medications will be authorized once you are discharged, as it is imperative that you return to your primary care physician (or establish a relationship with a primary care physician if you do not have one) for your aftercare needs so that they can reassess your need for medications and monitor your lab values.    On the day of Discharge:  VITAL SIGNS:  Blood pressure 140/80, pulse 95, temperature 97.8 F (36.6 C), temperature source Oral, resp. rate 20, height  (1.651 m), weight 55.792 kg (123 lb), SpO2 97 %. PHYSICAL EXAMINATION:  GENERAL:  82 y.o.-year-old patient lying in the bed with no acute distress.  EYES: Pupils equal, round, reactive to light and accommodation. No scleral icterus. Extraocular muscles intact.  HEENT: Head atraumatic, normocephalic. Oropharynx and nasopharynx clear.  NECK:  Supple, no jugular venous distention. No  thyroid enlargement, no tenderness.  LUNGS: Normal breath sounds bilaterally, no wheezing, rales,rhonchi or crepitation. No use of accessory muscles of respiration.  CARDIOVASCULAR: S1, S2 normal. No murmurs, rubs, or gallops.  ABDOMEN: Soft, non-tender, non-distended. Bowel sounds present. No organomegaly or mass.  EXTREMITIES: No pedal edema, cyanosis, or clubbing.  NEUROLOGIC: Cranial nerves II through XII are intact. Muscle strength 5/5 in all extremities. Sensation intact. Gait not checked.  PSYCHIATRIC: The patient is alert and oriented x 3.  SKIN: No obvious rash, lesion, or ulcer.  DATA REVIEW:   CBC  Recent Labs Lab 08/21/15 0301  WBC 15.7*  HGB 9.8*  HCT 30.1*  PLT 302    Chemistries   Recent Labs Lab 08/20/15 1317 08/21/15 0301  NA 138 140  K 4.1 3.4*  CL 107 108  CO2 22 23  GLUCOSE 103* 80  BUN 25* 25*  CREATININE 1.09* 1.11*  CALCIUM 8.8* 8.3*  AST 14*  --  ALT 11*  --   ALKPHOS 84  --   BILITOT 0.7  --     Cardiac Enzymes  Recent Labs Lab 08/20/15 1317  TROPONINI 0.04*    Microbiology Results  Results for orders placed or performed during the hospital encounter of 08/20/15  C difficile quick scan w PCR reflex     Status: Abnormal   Collection Time: 08/20/15  3:49 PM  Result Value Ref Range Status   C Diff antigen POSITIVE (A) NEGATIVE Final   C Diff toxin POSITIVE (A) NEGATIVE Final   C Diff interpretation   Final    Positive for toxigenic C. difficile, active toxin production present.    Comment: CRITICAL RESULT CALLED TO, READ BACK BY AND VERIFIED WITH: DONALD SWEENEY,RN 08/20/2015 1658 BY JRS.   MRSA PCR Screening     Status: None   Collection Time: 08/21/15  6:20 AM  Result Value Ref Range Status   MRSA by PCR NEGATIVE NEGATIVE Final    Comment:        The GeneXpert MRSA Assay (FDA approved for NASAL specimens only), is one component of a comprehensive MRSA colonization surveillance program. It is not intended to diagnose  MRSA infection nor to guide or monitor treatment for MRSA infections.     RADIOLOGY:  No results found.   Management plans discussed with the patient, family and they are in agreement.  CODE STATUS:     Code Status Orders        Start     Ordered   08/20/15 1850  Full code   Continuous     08/20/15 1849    Code Status History    Date Active Date Inactive Code Status Order ID Comments User Context   This patient has a current code status but no historical code status.      TOTAL TIME TAKING CARE OF THIS PATIENT: 55 minutes.    [facility], Lesslie Mossa M.D on 08/23/2015 at 11:16 AM  Between 7am to 6pm - Pager - [phone]  After 6pm go to [email or web address] - password [email or web address] [facility]  [facility]  Office  [phone]  CC: Primary care physician; Darcel Smalling , M.D. Erin Sons MD  Note: This dictation was prepared with Dragon dictation along with smaller phrase technology. Any transcriptional errors that result from this process are unintentional.

## 2015-08-23 NOTE — Progress Notes (Signed)
Patient is medically stable for D/C back to [facility] today. Per [facility] at [facility] they can provide transport at 2 pm for patient. Clinical Child psychotherapist (CSW) faxed D/C Summary and FL2 to [employer]. RN is aware of above. Patient's husband Tasia Catchings Rosello is aware of above. CSW provided husband with sitter list upon his request. Please reconsult if future social work needs arise. CSW signing off.   Jetta Lout, LCSW [phone]

## 2015-08-24 NOTE — Progress Notes (Signed)
Discharge Instructions:  Staff from [facility] here to pick pt up.Discharge packet given to staff member from [facility]. Pt wheeled to car.

## 2015-08-28 ENCOUNTER — Emergency Department: Payer: Medicare Other

## 2015-08-28 ENCOUNTER — Encounter: Payer: Self-pay | Admitting: Emergency Medicine

## 2015-08-28 ENCOUNTER — Inpatient Hospital Stay
Admission: EM | Admit: 2015-08-28 | Discharge: 2015-08-31 | DRG: 309 | Payer: Medicare Other | Attending: Internal Medicine | Admitting: Internal Medicine

## 2015-08-28 DIAGNOSIS — I4891 Unspecified atrial fibrillation: Secondary | ICD-10-CM | POA: Diagnosis present

## 2015-08-28 DIAGNOSIS — H3530 Unspecified macular degeneration: Secondary | ICD-10-CM | POA: Diagnosis present

## 2015-08-28 DIAGNOSIS — R7989 Other specified abnormal findings of blood chemistry: Secondary | ICD-10-CM | POA: Diagnosis present

## 2015-08-28 DIAGNOSIS — I129 Hypertensive chronic kidney disease with stage 1 through stage 4 chronic kidney disease, or unspecified chronic kidney disease: Secondary | ICD-10-CM | POA: Diagnosis present

## 2015-08-28 DIAGNOSIS — I248 Other forms of acute ischemic heart disease: Secondary | ICD-10-CM | POA: Diagnosis present

## 2015-08-28 DIAGNOSIS — A047 Enterocolitis due to Clostridium difficile: Secondary | ICD-10-CM | POA: Diagnosis present

## 2015-08-28 DIAGNOSIS — F0390 Unspecified dementia without behavioral disturbance: Secondary | ICD-10-CM | POA: Diagnosis present

## 2015-08-28 DIAGNOSIS — Z9889 Other specified postprocedural states: Secondary | ICD-10-CM | POA: Diagnosis not present

## 2015-08-28 DIAGNOSIS — I480 Paroxysmal atrial fibrillation: Secondary | ICD-10-CM | POA: Diagnosis present

## 2015-08-28 DIAGNOSIS — Z882 Allergy status to sulfonamides status: Secondary | ICD-10-CM | POA: Diagnosis not present

## 2015-08-28 DIAGNOSIS — E860 Dehydration: Secondary | ICD-10-CM | POA: Diagnosis present

## 2015-08-28 DIAGNOSIS — M810 Age-related osteoporosis without current pathological fracture: Secondary | ICD-10-CM | POA: Diagnosis present

## 2015-08-28 DIAGNOSIS — N183 Chronic kidney disease, stage 3 (moderate): Secondary | ICD-10-CM | POA: Diagnosis present

## 2015-08-28 DIAGNOSIS — R778 Other specified abnormalities of plasma proteins: Secondary | ICD-10-CM | POA: Diagnosis present

## 2015-08-28 DIAGNOSIS — E785 Hyperlipidemia, unspecified: Secondary | ICD-10-CM | POA: Diagnosis present

## 2015-08-28 DIAGNOSIS — Z87891 Personal history of nicotine dependence: Secondary | ICD-10-CM

## 2015-08-28 DIAGNOSIS — Z7982 Long term (current) use of aspirin: Secondary | ICD-10-CM

## 2015-08-28 DIAGNOSIS — E875 Hyperkalemia: Secondary | ICD-10-CM | POA: Diagnosis present

## 2015-08-28 DIAGNOSIS — Z79899 Other long term (current) drug therapy: Secondary | ICD-10-CM | POA: Diagnosis not present

## 2015-08-28 LAB — COMPREHENSIVE METABOLIC PANEL
ALBUMIN: 3.4 g/dL — AB (ref 3.5–5.0)
ALK PHOS: 62 U/L (ref 38–126)
ALT: 13 U/L — ABNORMAL LOW (ref 14–54)
ANION GAP: 8 (ref 5–15)
AST: 18 U/L (ref 15–41)
BUN: 29 mg/dL — ABNORMAL HIGH (ref 6–20)
CALCIUM: 9.2 mg/dL (ref 8.9–10.3)
CO2: 24 mmol/L (ref 22–32)
Chloride: 108 mmol/L (ref 101–111)
Creatinine, Ser: 1.3 mg/dL — ABNORMAL HIGH (ref 0.44–1.00)
GFR calc non Af Amer: 35 mL/min — ABNORMAL LOW (ref >60)
GFR, EST AFRICAN AMERICAN: 40 mL/min — AB (ref >60)
GLUCOSE: 98 mg/dL (ref 65–99)
POTASSIUM: 5.3 mmol/L — AB (ref 3.5–5.1)
Sodium: 140 mmol/L (ref 135–145)
Total Bilirubin: 0.6 mg/dL (ref 0.3–1.2)
Total Protein: 6.8 g/dL (ref 6.5–8.1)

## 2015-08-28 LAB — CBC WITH DIFFERENTIAL/PLATELET
BASOS ABS: 0 10*3/uL (ref 0–0.1)
Basophils Relative: 0 %
Eosinophils Absolute: 0.1 10*3/uL (ref 0–0.7)
Eosinophils Relative: 1 %
HEMATOCRIT: 38 % (ref 35.0–47.0)
Hemoglobin: 12.3 g/dL (ref 12.0–16.0)
LYMPHS PCT: 12 %
Lymphs Abs: 1.7 10*3/uL (ref 1.0–3.6)
MCH: 28.1 pg (ref 26.0–34.0)
MCHC: 32.5 g/dL (ref 32.0–36.0)
MCV: 86.6 fL (ref 80.0–100.0)
MONO ABS: 1.1 10*3/uL — AB (ref 0.2–0.9)
MONOS PCT: 8 %
NEUTROS ABS: 11.5 10*3/uL — AB (ref 1.4–6.5)
Neutrophils Relative %: 79 %
Platelets: 521 10*3/uL — ABNORMAL HIGH (ref 150–440)
RBC: 4.39 MIL/uL (ref 3.80–5.20)
RDW: 14.5 % (ref 11.5–14.5)
WBC: 14.5 10*3/uL — ABNORMAL HIGH (ref 3.6–11.0)

## 2015-08-28 LAB — URINALYSIS COMPLETE WITH MICROSCOPIC (ARMC ONLY)
Bilirubin Urine: NEGATIVE
GLUCOSE, UA: NEGATIVE mg/dL
HGB URINE DIPSTICK: NEGATIVE
NITRITE: NEGATIVE
PROTEIN: 30 mg/dL — AB
SPECIFIC GRAVITY, URINE: 1.024 (ref 1.005–1.030)
pH: 5 (ref 5.0–8.0)

## 2015-08-28 LAB — TROPONIN I
TROPONIN I: 0.06 ng/mL — AB (ref <0.031)
Troponin I: 0.07 ng/mL — ABNORMAL HIGH (ref <0.031)
Troponin I: 0.05 ng/mL — ABNORMAL HIGH (ref <0.031)

## 2015-08-28 LAB — TSH: TSH: 3.081 u[IU]/mL (ref 0.350–4.500)

## 2015-08-28 MED ORDER — IRBESARTAN 75 MG PO TABS
75.0000 mg | ORAL_TABLET | Freq: Every day | ORAL | Status: DC
  Administered 2015-08-29 – 2015-08-31 (×3): 75 mg via ORAL
  Filled 2015-08-28 (×3): qty 1

## 2015-08-28 MED ORDER — DILTIAZEM HCL 25 MG/5ML IV SOLN
10.0000 mg | Freq: Once | INTRAVENOUS | Status: AC
  Administered 2015-08-28: 10 mg via INTRAVENOUS

## 2015-08-28 MED ORDER — PANTOPRAZOLE SODIUM 40 MG PO TBEC
40.0000 mg | DELAYED_RELEASE_TABLET | Freq: Every day | ORAL | Status: DC
  Administered 2015-08-29 – 2015-08-31 (×3): 40 mg via ORAL
  Filled 2015-08-28 (×3): qty 1

## 2015-08-28 MED ORDER — METRONIDAZOLE 500 MG PO TABS
500.0000 mg | ORAL_TABLET | Freq: Three times a day (TID) | ORAL | Status: DC
  Administered 2015-08-28 – 2015-08-31 (×9): 500 mg via ORAL
  Filled 2015-08-28 (×9): qty 1

## 2015-08-28 MED ORDER — VITAMIN C 500 MG PO TABS
500.0000 mg | ORAL_TABLET | Freq: Two times a day (BID) | ORAL | Status: DC
  Administered 2015-08-29 – 2015-08-31 (×5): 500 mg via ORAL
  Filled 2015-08-28 (×5): qty 1

## 2015-08-28 MED ORDER — DILTIAZEM HCL 25 MG/5ML IV SOLN
INTRAVENOUS | Status: AC
  Administered 2015-08-28: 10 mg via INTRAVENOUS
  Filled 2015-08-28: qty 5

## 2015-08-28 MED ORDER — HEPARIN SODIUM (PORCINE) 5000 UNIT/ML IJ SOLN
5000.0000 [IU] | Freq: Three times a day (TID) | INTRAMUSCULAR | Status: DC
  Administered 2015-08-28 – 2015-08-30 (×4): 5000 [IU] via SUBCUTANEOUS
  Filled 2015-08-28 (×4): qty 1

## 2015-08-28 MED ORDER — ASPIRIN 81 MG PO CHEW
81.0000 mg | CHEWABLE_TABLET | Freq: Every day | ORAL | Status: DC
  Administered 2015-08-29 – 2015-08-31 (×3): 81 mg via ORAL
  Filled 2015-08-28 (×3): qty 1

## 2015-08-28 MED ORDER — VITAMIN D 1000 UNITS PO TABS
2000.0000 [IU] | ORAL_TABLET | Freq: Every day | ORAL | Status: DC
Start: 2015-08-28 — End: 2015-08-31
  Administered 2015-08-29 – 2015-08-31 (×3): 2000 [IU] via ORAL
  Filled 2015-08-28 (×5): qty 2

## 2015-08-28 MED ORDER — OXYCODONE HCL 5 MG PO TABS: 5.0000 mg | ORAL_TABLET | ORAL | Status: DC | PRN

## 2015-08-28 MED ORDER — DILTIAZEM HCL 100 MG IV SOLR
5.0000 mg/h | Freq: Once | INTRAVENOUS | Status: AC
  Administered 2015-08-28: 5 mg/h via INTRAVENOUS
  Filled 2015-08-28: qty 100

## 2015-08-28 MED ORDER — SODIUM CHLORIDE 0.9 % IV BOLUS (SEPSIS)
500.0000 mL | Freq: Once | INTRAVENOUS | Status: AC
  Administered 2015-08-28: 500 mL via INTRAVENOUS

## 2015-08-28 MED ORDER — MORPHINE SULFATE (PF) 2 MG/ML IV SOLN: 2.0000 mg | INTRAVENOUS | Status: DC | PRN

## 2015-08-28 MED ORDER — CALAZIME SKIN PROTECTANT EX PSTE: 1.0000 "application " | PASTE | CUTANEOUS | Status: DC | PRN

## 2015-08-28 MED ORDER — MIRTAZAPINE 15 MG PO TABS
7.5000 mg | ORAL_TABLET | Freq: Every day | ORAL | Status: DC
  Administered 2015-08-28 – 2015-08-30 (×3): 7.5 mg via ORAL
  Filled 2015-08-28 (×4): qty 1

## 2015-08-28 MED ORDER — POLYVINYL ALCOHOL 1.4 % OP SOLN
1.0000 [drp] | Freq: Four times a day (QID) | OPHTHALMIC | Status: DC | PRN
  Filled 2015-08-28: qty 15

## 2015-08-28 MED ORDER — SODIUM CHLORIDE 0.9 % IV SOLN
INTRAVENOUS | Status: DC
  Administered 2015-08-28 – 2015-08-30 (×3): via INTRAVENOUS

## 2015-08-28 MED ORDER — LATANOPROST 0.005 % OP SOLN
1.0000 [drp] | Freq: Every day | OPHTHALMIC | Status: DC
  Administered 2015-08-28 – 2015-08-30 (×3): 1 [drp] via OPHTHALMIC
  Filled 2015-08-28: qty 2.5

## 2015-08-28 MED ORDER — ZINC OXIDE 40 % EX OINT
TOPICAL_OINTMENT | CUTANEOUS | Status: DC | PRN
  Filled 2015-08-28: qty 114

## 2015-08-28 MED ORDER — ADULT MULTIVITAMIN W/MINERALS CH
1.0000 | ORAL_TABLET | Freq: Every day | ORAL | Status: DC
  Administered 2015-08-29 – 2015-08-31 (×3): 1 via ORAL
  Filled 2015-08-28 (×4): qty 1

## 2015-08-28 MED ORDER — BRIMONIDINE TARTRATE 0.2 % OP SOLN
1.0000 [drp] | Freq: Two times a day (BID) | OPHTHALMIC | Status: DC
  Administered 2015-08-28 – 2015-08-31 (×6): 1 [drp] via OPHTHALMIC
  Filled 2015-08-28: qty 5

## 2015-08-28 MED ORDER — TIMOLOL MALEATE 0.5 % OP SOLN
1.0000 [drp] | Freq: Every day | OPHTHALMIC | Status: DC
  Administered 2015-08-29 – 2015-08-31 (×3): 1 [drp] via OPHTHALMIC
  Filled 2015-08-28: qty 5

## 2015-08-28 MED ORDER — ACETAMINOPHEN 325 MG PO TABS
650.0000 mg | ORAL_TABLET | Freq: Four times a day (QID) | ORAL | Status: DC | PRN
Start: 2015-08-28 — End: 2015-08-31

## 2015-08-28 MED ORDER — MEGESTROL ACETATE 400 MG/10ML PO SUSP
400.0000 mg | Freq: Two times a day (BID) | ORAL | Status: DC
  Administered 2015-08-28 – 2015-08-31 (×6): 400 mg via ORAL
  Filled 2015-08-28 (×6): qty 10

## 2015-08-28 MED ORDER — ACETAMINOPHEN 650 MG RE SUPP: 650.0000 mg | Freq: Four times a day (QID) | RECTAL | Status: DC | PRN

## 2015-08-28 MED ORDER — ONDANSETRON HCL 4 MG/2ML IJ SOLN
4.0000 mg | Freq: Four times a day (QID) | INTRAMUSCULAR | Status: DC | PRN
Start: 2015-08-28 — End: 2015-08-31

## 2015-08-28 MED ORDER — DILTIAZEM LOAD VIA INFUSION
10.0000 mg | Freq: Once | INTRAVENOUS | Status: DC
  Filled 2015-08-28: qty 10

## 2015-08-28 MED ORDER — ONDANSETRON HCL 4 MG PO TABS: 4.0000 mg | ORAL_TABLET | Freq: Four times a day (QID) | ORAL | Status: DC | PRN

## 2015-08-28 MED ORDER — SODIUM CHLORIDE 0.9% FLUSH
3.0000 mL | Freq: Two times a day (BID) | INTRAVENOUS | Status: DC
  Administered 2015-08-28 – 2015-08-30 (×3): 3 mL via INTRAVENOUS

## 2015-08-28 MED ORDER — CARBOXYMETHYLCELLULOSE SODIUM 1 % OP SOLN: 2.0000 [drp] | Freq: Two times a day (BID) | OPHTHALMIC | Status: DC

## 2015-08-28 MED ORDER — CLOPIDOGREL BISULFATE 75 MG PO TABS
75.0000 mg | ORAL_TABLET | Freq: Every day | ORAL | Status: DC
  Administered 2015-08-29 – 2015-08-31 (×3): 75 mg via ORAL
  Filled 2015-08-28 (×3): qty 1

## 2015-08-28 NOTE — Plan of Care (Signed)
Problem: Education: Goal: Knowledge of [facility] Education information/materials will improve Outcome: Completed/Met Date Met:  08/28/15 family

## 2015-08-28 NOTE — ED Provider Notes (Signed)
Time Seen: Approximately ----------------------------------------- 12:56 PM on 08/28/2015 -----------------------------------------    I have reviewed the triage notes  Chief Complaint: Generalized Body Aches   History of Present Illness: Adrienne Humphrey is a 82 y.o. female who was here in emergency department approximately a week ago and diagnosed with C. difficile diarrhea. Patient was sent back from the nursing facility for generalized weakness. Patient denies any focal pain. And is demented but able to answer some questions appropriately. She denies any current chest pain, abdominal pain, bloody stool, focal weakness, nausea, vomiting, etc. Patient has been on Flagyl for approximately 5 days at 500 mg 3 times a day.   Past Medical History  Diagnosis Date  . Hyperlipidemia   . Dementia   . Macular degeneration   . Osteoporosis     Patient Active Problem List   Diagnosis Date Noted  . C. difficile colitis 08/20/2015    Past Surgical History  Procedure Laterality Date  . Abdominal surgery      mass removed in 80s  . Hip surgery      Past Surgical History  Procedure Laterality Date  . Abdominal surgery      mass removed in 80s  . Hip surgery      Current Outpatient Rx  Name  Route  Sig  Dispense  Refill  . aspirin 81 MG chewable tablet   Oral   Chew 81 mg by mouth daily.         . brimonidine (ALPHAGAN) 0.2 % ophthalmic solution   Both Eyes   Place 1 drop into both eyes 2 (two) times daily.         . carboxymethylcellulose 1 % ophthalmic solution   Both Eyes   Place 2 drops into both eyes 2 (two) times daily.         . Cholecalciferol (VITAMIN D) 2000 UNITS CAPS   Oral   Take 2,000 Units by mouth daily.         . clopidogrel (PLAVIX) 75 MG tablet   Oral   Take 75 mg by mouth daily.         . Eyelid Cleansers (OCUSOFT LID SCRUB EX)   Apply externally   Apply 1 application topically daily.         . megestrol (MEGACE) 400 MG/10ML  suspension   Oral   Take 10 mLs (400 mg total) by mouth 2 (two) times daily.   240 mL   0   . metroNIDAZOLE (FLAGYL) 500 MG tablet   Oral   Take 1 tablet (500 mg total) by mouth 3 (three) times daily.   21 tablet   0   . mirtazapine (REMERON) 7.5 MG tablet   Oral   Take 7.5 mg by mouth at bedtime.         . Multiple Vitamin (MULTIVITAMIN WITH MINERALS) TABS tablet   Oral   Take 1 tablet by mouth daily.         . pantoprazole (PROTONIX) 40 MG tablet   Oral   Take 40 mg by mouth daily.         . polyvinyl alcohol (LIQUIFILM TEARS) 1.4 % ophthalmic solution   Both Eyes   Place 1 drop into both eyes 4 (four) times daily as needed for dry eyes.         . Skin Protectants, Misc. (CALAZIME SKIN PROTECTANT) PSTE   Apply externally   Apply 1 application topically as needed (with each diaper change).         Marland Kitchen  timolol (TIMOPTIC) 0.5 % ophthalmic solution   Both Eyes   Place 1 drop into both eyes daily.         . Travoprost, BAK Free, (TRAVATAN) 0.004 % SOLN ophthalmic solution   Both Eyes   Place 1 drop into both eyes at bedtime.         . valsartan (DIOVAN) 80 MG tablet   Oral   Take 80 mg by mouth daily.         . vitamin C (ASCORBIC ACID) 500 MG tablet   Oral   Take 500 mg by mouth 2 (two) times daily.         Cliffton Asters Petrolatum-Mineral Oil (ARTIFICIAL TEARS) OINT ophthalmic ointment   Both Eyes   Place 1 application into both eyes at bedtime.           Allergies:  Ace inhibitors and Sulfa antibiotics  Family History: No family history on file.  Social History: Social History  Substance Use Topics  . Smoking status: Former Games developer  . Smokeless tobacco: Not on file  . Alcohol Use: No     Review of Systems:   10 point review of systems was performed and was otherwise negative: Patient has a history of dementia and her review of systems was acquired from the patient, EMS, and the medical record Constitutional: No fever Eyes: No visual  disturbances ENT: No sore throat, ear pain Cardiac: No chest pain Respiratory: No shortness of breath, wheezing, or stridor Abdomen: No abdominal pain, no vomiting, No diarrhea Endocrine: No weight loss, No night sweats Extremities: No peripheral edema, cyanosis Skin: No rashes, easy bruising Neurologic: No focal weakness, trouble with speech or swollowing Urologic: No dysuria, Hematuria, or urinary frequency   Physical Exam:  ED Triage Vitals  Enc Vitals Group     BP --      Pulse --      Resp --      Temp --      Temp src --      SpO2 --      Weight --      Height --      Head Cir --      Peak Flow --      Pain Score --      Pain Loc --      Pain Edu? --      Excl. in GC? --     General: Awake , Alert , and Oriented times 2, Glasgow Coma Scale 15 Head: Normal cephalic , atraumatic Eyes: Pupils equal , round, reactive to light Nose/Throat: No nasal drainage, patent upper airway without erythema or exudate. Dry mucous membranes Neck: Supple, Full range of motion, No anterior adenopathy or palpable thyroid masses Lungs: Clear to ascultation without wheezes , rhonchi, or rales Heart: Irregular rate, irregular rhythm without murmurs , gallops , or rubs Abdomen: Soft, non tender without rebound, guarding , or rigidity; bowel sounds positive and hyperactive in all 4 quadrants. No organomegaly .      No palpable masses  Extremities: 2 plus symmetric pulses. No edema, clubbing or cyanosis Neurologic: , Motor symmetric without deficits, sensory intact Skin: warm, dry, no rashes   Labs:   All laboratory work was reviewed including any pertinent negatives or positives listed below:  Labs Reviewed  CBC WITH DIFFERENTIAL/PLATELET  COMPREHENSIVE METABOLIC PANEL  URINALYSIS COMPLETEWITH MICROSCOPIC ([facility] ONLY)  TROPONIN I    EKG: ED ECG REPORT I, Jennye Moccasin, the attending physician, personally  viewed and interpreted this ECG.  Date: 08/28/2015 EKG Time:  1300 Rate: 118 Rhythm: Atrial fibrillation with occasional PVC QRS Axis: normal Intervals: normal ST/T Wave abnormalities: normal Conduction Disturbances: none Narrative Interpretation: unremarkable Atrial fibrillation appears to be a new finding in comparison to last EKG No acute ischemic changes noted   Radiology:  CLINICAL DATA: Known C. difficile infection with abdominal pain  EXAM: DG ABDOMEN ACUTE W/ 1V CHEST  COMPARISON: None.  FINDINGS: Cardiac shadow is within normal limits. The lungs are well aerated bilaterally. Diffuse interstitial changes and scarring are noted. No acute infiltrate is seen.  The abdomen demonstrates scattered large and small bowel gas. No obstructive changes are seen. Degenerative changes of the lumbar spine are noted. Aortic calcifications are seen. Changes of prior fixation of the proximal left femur are noted.  IMPRESSION: No acute abnormality in the chest and abdomen.      I personally reviewed the radiologic studies    Critical Care: CRITICAL CARE Performed by: Jennye Moccasin   Total critical care time: 37 minutes  Critical care time was exclusive of separately billable procedures and treating other patients.  Critical care was necessary to treat or prevent imminent or life-threatening deterioration.  Critical care was time spent personally by me on the following activities: development of treatment plan with patient and/or surrogate as well as nursing, discussions with consultants, evaluation of patient's response to treatment, examination of patient, obtaining history from patient or surrogate, ordering and performing treatments and interventions, ordering and review of laboratory studies, ordering and review of radiographic studies, pulse oximetry and re-evaluation of patient's condition. Critical care mainly in initiation of antiarrhythmic medication.   ED Course:  Patient's stay here was uneventful and her blood  pressure remained stable with initiation of diltiazem bolus and then follow that with the diltiazem drip. Patient has some mild dehydration was given an IV fluid bolus. Her generalized fatigue may be secondary to the new onset atrial fibrillation. The patient otherwise is ongoing treatment for C. difficile and her diarrhea seems to have improved    Assessment: * Paroxysmal atrial fibrillation C. difficile      Plan: * Inpatient management Patient's case was reviewed with the hospitalist team, further disposition and management depends upon their evaluation.           Jennye Moccasin, MD 08/28/15 1556

## 2015-08-28 NOTE — ED Notes (Signed)
Pt to ED via EMS from [facility] Asst living c/o "just not feeling [place]." Pt has hx C-diff, has been on flagyl. No c/o pain, nausea, vomiting. Pt states she does feel some generalized weakness and still has diarrhea.

## 2015-08-28 NOTE — ED Notes (Signed)
MD Quigley at bedside. 

## 2015-08-28 NOTE — ED Notes (Signed)
Discussed UA collection with MD Huel Cote. MD said to wait to collect urine sample to see if admitting MD still requires UA.

## 2015-08-28 NOTE — Progress Notes (Signed)
Pt admitted to room 242, admission profile complete, no pain issues, pt is pleasantly demented, bed alarm on, I&O cath done to collect UA, VS WDL, HR 85-100, no issues at end of shift.

## 2015-08-28 NOTE — H&P (Signed)
[facility] - [place] at [facility]   PATIENT NAME: Adrienne Humphrey    MR#:  [id number]  DATE OF BIRTH:  [birth year]   DATE OF ADMISSION:  08/28/2015  PRIMARY CARE PHYSICIAN: No PCP Per Patient   REQUESTING/REFERRING PHYSICIAN: Huel Cote  CHIEF COMPLAINT:   Chief Complaint  Patient presents with  . Generalized Body Aches   "not feeling well"  HISTORY OF PRESENT ILLNESS:  Adrienne Humphrey  is a 82 y.o. female with a known history of dementia unspecified, recent C. difficile infection currently on oral Flagyl presenting from assisted living facility due to "not feeling well. Patient unable to provide meaningful information given mental status/medical condition history obtained from husband/son at bedside. They state since discharge hospital she's been fatigue/lethargic however has been gradually improving. Her diarrhea has significantly slowed down. However, her oral intake is lacking. The patient states that she felt unwell she is evaluated by nursing staff and sent to the Hospital further workup and evaluation.  Emergency department she was noted be in atrial fibrillation rapid ventricular response heart 120s she received bolus of IV Cardizem and subsequent surgery on Cardizem drip.  PAST MEDICAL HISTORY:   Past Medical History  Diagnosis Date  . Hyperlipidemia   . Dementia   . Macular degeneration   . Osteoporosis     PAST SURGICAL HISTORY:   Past Surgical History  Procedure Laterality Date  . Abdominal surgery      mass removed in 80s  . Hip surgery      SOCIAL HISTORY:   Social History  Substance Use Topics  . Smoking status: Former Games developer  . Smokeless tobacco: Not on file  . Alcohol Use: No    FAMILY HISTORY:   Family History  Problem Relation Age of Onset  . Diabetes Neg Hx     DRUG ALLERGIES:   Allergies  Allergen Reactions  . Ace Inhibitors Other (See Comments)    Reaction:  Unknown   . Sulfa Antibiotics Other (See Comments)   Reaction:  Unknown     REVIEW OF SYSTEMS:  Unable to obtain at this time given mental status/medical condition   MEDICATIONS AT HOME:   Prior to Admission medications   Medication Sig Start Date End Date Taking? Authorizing Provider  aspirin 81 MG chewable tablet Chew 81 mg by mouth daily.    Historical Provider, MD  brimonidine (ALPHAGAN) 0.2 % ophthalmic solution Place 1 drop into both eyes 2 (two) times daily.    Historical Provider, MD  carboxymethylcellulose 1 % ophthalmic solution Place 2 drops into both eyes 2 (two) times daily.    Historical Provider, MD  Cholecalciferol (VITAMIN D) 2000 UNITS CAPS Take 2,000 Units by mouth daily.    Historical Provider, MD  clopidogrel (PLAVIX) 75 MG tablet Take 75 mg by mouth daily.    Historical Provider, MD  Eyelid Cleansers (OCUSOFT LID SCRUB EX) Apply 1 application topically daily.    Historical Provider, MD  megestrol (MEGACE) 400 MG/10ML suspension Take 10 mLs (400 mg total) by mouth 2 (two) times daily. 08/23/15   Delfino Lovett, MD  metroNIDAZOLE (FLAGYL) 500 MG tablet Take 1 tablet (500 mg total) by mouth 3 (three) times daily. 08/23/15   Delfino Lovett, MD  mirtazapine (REMERON) 7.5 MG tablet Take 7.5 mg by mouth at bedtime.    Historical Provider, MD  Multiple Vitamin (MULTIVITAMIN WITH MINERALS) TABS tablet Take 1 tablet by mouth daily.    Historical Provider, MD  pantoprazole (PROTONIX) 40 MG  tablet Take 40 mg by mouth daily.    Historical Provider, MD  polyvinyl alcohol (LIQUIFILM TEARS) 1.4 % ophthalmic solution Place 1 drop into both eyes 4 (four) times daily as needed for dry eyes.    Historical Provider, MD  Skin Protectants, Misc. (CALAZIME SKIN PROTECTANT) PSTE Apply 1 application topically as needed (with each diaper change).    Historical Provider, MD  timolol (TIMOPTIC) 0.5 % ophthalmic solution Place 1 drop into both eyes daily.    Historical Provider, MD  Travoprost, BAK Free, (TRAVATAN) 0.004 % SOLN ophthalmic solution Place 1 drop  into both eyes at bedtime.    Historical Provider, MD  valsartan (DIOVAN) 80 MG tablet Take 80 mg by mouth daily.    Historical Provider, MD  vitamin C (ASCORBIC ACID) 500 MG tablet Take 500 mg by mouth 2 (two) times daily.    Historical Provider, MD  White Petrolatum-Mineral Oil (ARTIFICIAL TEARS) OINT ophthalmic ointment Place 1 application into both eyes at bedtime.    Historical Provider, MD      VITAL SIGNS:  Blood pressure 136/77, pulse 88, temperature 98.1 F (36.7 C), temperature source Oral, resp. rate 25, height  (1.676 m), weight 120 lb (54.432 kg), SpO2 99 %.  PHYSICAL EXAMINATION:  VITAL SIGNS: Filed Vitals:   08/28/15 1500 08/28/15 1530  BP: 133/74 136/77  Pulse: 87 88  Temp:    Resp: 25    GENERAL:82 y.o.female currently in moderate acute distress given mental status  HEAD: Normocephalic, atraumatic.  EYES: Pupils equal, round, reactive to light. Extraocular muscles intact. No scleral icterus.  MOUTH: dry mucosal membrane. Dentition intact. No abscess noted.  EAR, NOSE, THROAT: Clear without exudates. No external lesions.  NECK: Supple. No thyromegaly. No nodules. No JVD.  PULMONARY: Clear to ascultation, without wheeze rails or rhonci. No use of accessory muscles, Good respiratory effort. good air entry bilaterally CHEST: Nontender to palpation.  CARDIOVASCULAR: S1 and S2. irRegular rate and rhythm. No murmurs, rubs, or gallops. No edema. Pedal pulses 2+ bilaterally.  GASTROINTESTINAL: Soft, nontender, nondistended. No masses. Positive bowel sounds. No hepatosplenomegaly.  MUSCULOSKELETAL: No swelling, clubbing, or edema. Range of motion full in all extremities.  NEUROLOGIC: Cranial nerves II through XII are intact. No gross focal neurological deficits. Sensation intact. Reflexes intact.  SKIN: No ulceration, lesions, rashes, or cyanosis. Skin warm and dry. Turgor intact.  PSYCHIATRIC: Mood, affect flattened. The patient is awake, alert and oriented to self.  Insight, judgment poor.    LABORATORY PANEL:   CBC  Recent Labs Lab 08/28/15 1425  WBC 14.5*  HGB 12.3  HCT 38.0  PLT 521*   ------------------------------------------------------------------------------------------------------------------  Chemistries   Recent Labs Lab 08/28/15 1425  NA 140  K 5.3*  CL 108  CO2 24  GLUCOSE 98  BUN 29*  CREATININE 1.30*  CALCIUM 9.2  AST 18  ALT 13*  ALKPHOS 62  BILITOT 0.6   ------------------------------------------------------------------------------------------------------------------  Cardiac Enzymes  Recent Labs Lab 08/28/15 1425  TROPONINI 0.07*   ------------------------------------------------------------------------------------------------------------------  RADIOLOGY:  Dg Abd Acute W/chest  08/28/2015  CLINICAL DATA:  Known C. difficile infection with abdominal pain EXAM: DG ABDOMEN ACUTE W/ 1V CHEST COMPARISON:  None. FINDINGS: Cardiac shadow is within normal limits. The lungs are well aerated bilaterally. Diffuse interstitial changes and scarring are noted. No acute infiltrate is seen. The abdomen demonstrates scattered large and small bowel gas. No obstructive changes are seen. Degenerative changes of the lumbar spine are noted. Aortic calcifications are seen. Changes of prior  fixation of the proximal left femur are noted. IMPRESSION: No acute abnormality in the chest and abdomen. Electronically Signed   By: Alcide Clever M.D.   On: 08/28/2015 13:34    EKG:   Orders placed or performed during the hospital encounter of 08/28/15  . ED EKG  . ED EKG    IMPRESSION AND PLAN:   82 year old Caucasian female history of dementia as well as recent C. difficile infection found being atrial fibrillation rapid ventricular response  1. Atrial fibrillation rapid ventricular response: Received Cardizem bolus/Cardizem drip In emergency department continue drip goal heart rate less than 120, check TSH, check echocardiogram,  cardiac enzymes, telemetry, consult cardiology, [place] score 1 given age but high risk for falls will avoid chronic anticoagulation for now 2. Elevated troponin: Likely demand given atrial fibrillation place on telemetry trend cardiac enzymes if continued upward trend heparin drip, continue with aspirin 3. Hyperkalemia: IV fluid hydration. Potassium level closely may require further intervention 4. Essential hypertension: Valsartan 5. C. difficile infection prior to arrival: Continue oral Flagyl 6. Venous thrombus embolism prophylactic: Heparin subcutaneous   All the records are reviewed and case discussed with ED provider. Management plans discussed with the patient, family and they are in agreement.  CODE STATUS: Full  TOTAL TIME TAKING CARE OF THIS PATIENT: 45  minutes.    Hower,  Mardi Mainland.D on 08/28/2015 at 4:15 PM  Between 7am to 6pm - Pager - [phone]  After 6pm: House Pager: - [phone]  [facility]  Office  [phone]  CC: Primary care physician; No PCP Per Patient

## 2015-08-29 ENCOUNTER — Inpatient Hospital Stay
Admit: 2015-08-29 | Discharge: 2015-08-29 | Disposition: A | Discharge status: Home / Self Care | Payer: Medicare Other | Attending: Internal Medicine | Admitting: Internal Medicine

## 2015-08-29 DIAGNOSIS — I480 Paroxysmal atrial fibrillation: Secondary | ICD-10-CM | POA: Diagnosis not present

## 2015-08-29 LAB — BASIC METABOLIC PANEL
ANION GAP: 6 (ref 5–15)
BUN: 26 mg/dL — AB (ref 6–20)
CALCIUM: 8.5 mg/dL — AB (ref 8.9–10.3)
CO2: 23 mmol/L (ref 22–32)
Chloride: 114 mmol/L — ABNORMAL HIGH (ref 101–111)
Creatinine, Ser: 1.25 mg/dL — ABNORMAL HIGH (ref 0.44–1.00)
GFR calc Af Amer: 42 mL/min — ABNORMAL LOW (ref >60)
GFR calc non Af Amer: 36 mL/min — ABNORMAL LOW (ref >60)
GLUCOSE: 93 mg/dL (ref 65–99)
POTASSIUM: 4.2 mmol/L (ref 3.5–5.1)
Sodium: 143 mmol/L (ref 135–145)

## 2015-08-29 LAB — CBC
HEMATOCRIT: 31.8 % — AB (ref 35.0–47.0)
HEMOGLOBIN: 10.3 g/dL — AB (ref 12.0–16.0)
MCH: 28.2 pg (ref 26.0–34.0)
MCHC: 32.3 g/dL (ref 32.0–36.0)
MCV: 87.5 fL (ref 80.0–100.0)
Platelets: 458 10*3/uL — ABNORMAL HIGH (ref 150–440)
RBC: 3.64 MIL/uL — ABNORMAL LOW (ref 3.80–5.20)
RDW: 14.4 % (ref 11.5–14.5)
WBC: 10.6 10*3/uL (ref 3.6–11.0)

## 2015-08-29 LAB — TROPONIN I: Troponin I: 0.04 ng/mL — ABNORMAL HIGH (ref <0.031)

## 2015-08-29 NOTE — Consult Note (Signed)
[facility] Cardiology Consultation Note  Patient ID: Adrienne Humphrey, MRN: [id number], DOB/AGE: [birth year] 82 y.o. Admit date: 08/28/2015   Date of Consult: 08/29/2015 Primary Physician: No PCP Per Patient Primary Cardiologist: None  Chief Complaint:  Chief Complaint  Patient presents with  . Generalized Body Aches   Reason for Consult: atrial fibrillation with rapid ventricular rate with elevated troponin  HPI: 82 y.o. female with significant concerns of dementia and chronic kidney disease stage 3 with recent significant weakness and fatigue. The patient has had apparent new onset of atrial fibrillation with rapid ventricular rate and no current as and of significant chest discomfort or congestive heart failure. There is an elevated troponin more consistent with the demand ischemia rather than acute coronary syndrome and EKG shows atrial fibrillation with rapid ventricular rate. The patient now has better heart rate control even without additional medication management but will possibly need oral medication management. The patient has a very high CHA DS score although there is concerns of fall risk in the near future  Past Medical History  Diagnosis Date  . Hyperlipidemia   . Dementia   . Macular degeneration   . Osteoporosis       Surgical History:  Past Surgical History  Procedure Laterality Date  . Abdominal surgery      mass removed in 80s  . Hip surgery       Home Meds: Prior to Admission medications   Medication Sig Start Date End Date Taking? Authorizing Provider  aspirin 81 MG chewable tablet Chew 81 mg by mouth daily.   Yes Historical Provider, MD  brimonidine (ALPHAGAN) 0.2 % ophthalmic solution Place 1 drop into both eyes 2 (two) times daily.   Yes Historical Provider, MD  carboxymethylcellulose 1 % ophthalmic solution Place 2 drops into both eyes 2 (two) times daily.   Yes Historical Provider, MD  Cholecalciferol (VITAMIN D) 2000 UNITS CAPS Take 2,000 Units by mouth  daily.   Yes Historical Provider, MD  clopidogrel (PLAVIX) 75 MG tablet Take 75 mg by mouth daily.   Yes Historical Provider, MD  docusate calcium (SURFAK) 240 MG capsule Take 240 mg by mouth at bedtime.   Yes Historical Provider, MD  Eyelid Cleansers (OCUSOFT LID SCRUB EX) Place 1 application into both eyes every morning. Apply to lids.   Yes Historical Provider, MD  megestrol (MEGACE) 400 MG/10ML suspension Take 10 mLs (400 mg total) by mouth 2 (two) times daily. 08/23/15  Yes Vipul Sherryll Burger, MD  metroNIDAZOLE (FLAGYL) 500 MG tablet Take 1 tablet (500 mg total) by mouth 3 (three) times daily. 08/23/15  Yes Vipul Sherryll Burger, MD  mirtazapine (REMERON) 15 MG tablet Take 7.5 mg by mouth daily.   Yes Historical Provider, MD  Multiple Vitamin (MULTIVITAMIN WITH MINERALS) TABS tablet Take 1 tablet by mouth daily.   Yes Historical Provider, MD  pantoprazole (PROTONIX) 40 MG tablet Take 40 mg by mouth daily.   Yes Historical Provider, MD  polyvinyl alcohol (ARTIFICIAL TEARS) 1.4 % ophthalmic solution Place 1 drop into both eyes 4 (four) times daily.   Yes Historical Provider, MD  Skin Protectants, Misc. (CALAZIME SKIN PROTECTANT) PSTE Apply 1 application topically as needed (with each diaper change).   Yes Historical Provider, MD  timolol (TIMOPTIC) 0.5 % ophthalmic solution Place 1 drop into both eyes every morning.    Yes Historical Provider, MD  Travoprost, BAK Free, (TRAVATAN) 0.004 % SOLN ophthalmic solution Place 1 drop into both eyes at bedtime.   Yes Historical  Provider, MD  valsartan (DIOVAN) 80 MG tablet Take 80 mg by mouth daily.   Yes Historical Provider, MD  vitamin C (ASCORBIC ACID) 500 MG tablet Take 500 mg by mouth 2 (two) times daily.   Yes Historical Provider, MD  White Petrolatum-Mineral Oil (ARTIFICIAL TEARS) OINT ophthalmic ointment Place 1 application into both eyes at bedtime.   Yes Historical Provider, MD    Inpatient Medications:  . aspirin  81 mg Oral Daily  . brimonidine  1 drop Both Eyes  BID  . cholecalciferol  2,000 Units Oral Daily  . clopidogrel  75 mg Oral Daily  . heparin  5,000 Units Subcutaneous 3 times per day  . irbesartan  75 mg Oral Daily  . latanoprost  1 drop Both Eyes QHS  . megestrol  400 mg Oral BID  . metroNIDAZOLE  500 mg Oral TID  . mirtazapine  7.5 mg Oral QHS  . multivitamin with minerals  1 tablet Oral Daily  . pantoprazole  40 mg Oral Daily  . sodium chloride flush  3 mL Intravenous Q12H  . timolol  1 drop Both Eyes Daily  . vitamin C  500 mg Oral BID   . sodium chloride 75 mL/hr at 08/29/15 0554    Allergies:  Allergies  Allergen Reactions  . Ace Inhibitors Other (See Comments)    Reaction:  Unknown   . Sulfa Antibiotics Other (See Comments)    Reaction:  Unknown     Social History   Social History  . Marital Status: Married    Spouse Name: N/A  . Number of Children: N/A  . Years of Education: N/A   Occupational History  . Not on file.   Social History Main Topics  . Smoking status: Former Games developer  . Smokeless tobacco: Not on file  . Alcohol Use: No  . Drug Use: No  . Sexual Activity: Not on file   Other Topics Concern  . Not on file   Social History Narrative     Family History  Problem Relation Age of Onset  . Diabetes Neg Hx      Review of Systems Positive for unable to assess due to dementia  Labs:  Recent Labs  08/28/15 1425 08/28/15 1757 08/28/15 2304 08/29/15 0536  TROPONINI 0.07* 0.06* 0.05* 0.04*   Lab Results  Component Value Date   WBC 10.6 08/29/2015   HGB 10.3* 08/29/2015   HCT 31.8* 08/29/2015   MCV 87.5 08/29/2015   PLT 458* 08/29/2015    Recent Labs Lab 08/28/15 1425 08/29/15 0536  NA 140 143  K 5.3* 4.2  CL 108 114*  CO2 24 23  BUN 29* 26*  CREATININE 1.30* 1.25*  CALCIUM 9.2 8.5*  PROT 6.8  --   BILITOT 0.6  --   ALKPHOS 62  --   ALT 13*  --   AST 18  --   GLUCOSE 98 93   No results found for: CHOL, HDL, LDLCALC, TRIG No results found for:  DDIMER  Radiology/Studies:  Dg Chest Portable 1 View  08/20/2015  CLINICAL DATA:  Weakness.  Diarrhea.  Cough and fever. EXAM: PORTABLE CHEST - 1 VIEW COMPARISON:  One-view chest x-ray 07/03/2013 FINDINGS: The heart size is normal. Atherosclerotic calcifications are present at the aortic arch. Emphysema is noted. The visualized soft tissues and bony thorax are unremarkable. IMPRESSION: 1. No acute cardiopulmonary disease. 2. Atherosclerosis. 3. Emphysema. Electronically Signed   By: Marin Roberts M.D.   On: 08/20/2015 15:46  Dg Abd Acute W/chest  08/28/2015  CLINICAL DATA:  Known C. difficile infection with abdominal pain EXAM: DG ABDOMEN ACUTE W/ 1V CHEST COMPARISON:  None. FINDINGS: Cardiac shadow is within normal limits. The lungs are well aerated bilaterally. Diffuse interstitial changes and scarring are noted. No acute infiltrate is seen. The abdomen demonstrates scattered large and small bowel gas. No obstructive changes are seen. Degenerative changes of the lumbar spine are noted. Aortic calcifications are seen. Changes of prior fixation of the proximal left femur are noted. IMPRESSION: No acute abnormality in the chest and abdomen. Electronically Signed   By: Alcide Clever M.D.   On: 08/28/2015 13:34    EKG: Atrial fibrillation with rapid ventricular rate  Weights: Filed Weights   08/28/15 1254 08/28/15 1727  Weight: 120 lb (54.432 kg) 119 lb 14.4 oz (54.386 kg)     Physical Exam: Blood pressure 118/83, pulse 97, temperature 98 F (36.7 C), temperature source Oral, resp. rate 18, height  (1.676 m), weight 119 lb 14.4 oz (54.386 kg), SpO2 96 %. Body mass index is 19.36 kg/(m^2). General: Well developed, well nourished, in no acute distress. Head eyes ears nose throat: Normocephalic, atraumatic, sclera non-icteric, no xanthomas, nares are without discharge. No apparent thyromegaly and/or mass  Lungs: Normal respiratory effort.  Few wheezes, no rales, no rhonchi.  Heart:  Irregular with normal S1 S2. 2+ apical murmur gallop, no rub, PMI is normal size and placement, carotid upstroke normal without bruit, jugular venous pressure is normal Abdomen: Soft, non-tender, non-distended with normoactive bowel sounds. No hepatomegaly. No rebound/guarding. No obvious abdominal masses. Abdominal aorta is normal size  Extremities trace edema. no cyanosis, no clubbing, no ulcers  Peripheral : 2+ bilateral upper extremity pulses, 2+ bilateral femoral pulses, 2+ bilateral dorsal pedal pulse Neuro: Not Alert and oriented. No facial asymmetry. No focal deficit. Moves all extremities spontaneously. Musculoskeletal: Normal muscle tone without kyphosis     Assessment: 82 year old female with severe dementia and new onset of nonvalvular atrial fibrillation with rapid ventricular rate with chronic kidney disease stage III and elevated troponin more consistent with demand ischemia rather than acute coronary syndrome  Plan: 1. Consider low-dose beta blocker for heart rate control of atrial fibrillation 2. Treatment of dehydration and other possible causes of atrial fibrillation 3. Echocardiogram for LV systolic dysfunction valvular heart disease contributing to above 4. No treatment of long term anticoagulation due to concerns of fall risk although Plavix and aspirin for other causes may be appropriate at this time 5. Ambulation and adjustments of medication management for heart rate control 6. Further treatment options after above  Signed, Lamar Blinks M.D. [facility] Cardiology 08/29/2015, 7:14 AM

## 2015-08-29 NOTE — Plan of Care (Signed)
Problem: Safety: Goal: Ability to remain free from injury will improve Outcome: Progressing Bed alarm on   

## 2015-08-29 NOTE — Progress Notes (Signed)
[facility] - [place] at [facility]   PATIENT NAME: Adrienne Humphrey    MR#:  [id number]  DATE OF BIRTH:  30-Mar-1921  SUBJECTIVE:  CHIEF COMPLAINT:  Patient is resting comfortably. Denies any palpitations or shortness of breath. Denies any abdominal pain. Reporting some diarrhea but no blood. Husband and daughter-in-law are at bedside  REVIEW OF SYSTEMS:  CONSTITUTIONAL: No fever, fatigue or weakness.  EYES: No blurred or double vision.  EARS, NOSE, AND THROAT: No tinnitus or ear pain.  RESPIRATORY: No cough, shortness of breath, wheezing or hemoptysis.  CARDIOVASCULAR: No chest pain, orthopnea, edema.  GASTROINTESTINAL: No nausea, vomiting, reports some diarrhea , no abdominal pain.  GENITOURINARY: No dysuria, hematuria.  ENDOCRINE: No polyuria, nocturia,  HEMATOLOGY: No anemia, easy bruising or bleeding SKIN: No rash or lesion. MUSCULOSKELETAL: No joint pain or arthritis.   NEUROLOGIC: No tingling, numbness, weakness.  PSYCHIATRY: No anxiety or depression.   DRUG ALLERGIES:   Allergies  Allergen Reactions  . Ace Inhibitors Other (See Comments)    Reaction:  Unknown   . Sulfa Antibiotics Other (See Comments)    Reaction:  Unknown     VITALS:  Blood pressure 139/79, pulse 89, temperature 98.1 F (36.7 C), temperature source Oral, resp. rate 18, height  (1.676 m), weight 54.386 kg (119 lb 14.4 oz), SpO2 97 %.  PHYSICAL EXAMINATION:  GENERAL:  82 y.o.-year-old patient lying in the bed with no acute distress.  EYES: Pupils equal, round, reactive to light and accommodation. No scleral icterus. Extraocular muscles intact.  HEENT: Head atraumatic, normocephalic. Oropharynx and nasopharynx clear.  NECK:  Supple, no jugular venous distention. No thyroid enlargement, no tenderness.  LUNGS: Normal breath sounds bilaterally, no wheezing, rales,rhonchi or crepitation. No use of accessory muscles of respiration.  CARDIOVASCULAR: Irregularly irregular, No  murmurs, rubs, or gallops.  ABDOMEN: Soft, nontender, nondistended. Bowel sounds present. No organomegaly or mass.  EXTREMITIES: No pedal edema, cyanosis, or clubbing.  NEUROLOGIC: Cranial nerves II through XII are intact. Muscle strength 5/5 in all extremities. Sensation intact. Gait not checked.  PSYCHIATRIC: The patient is alert and oriented x 3.  SKIN: No obvious rash, lesion, or ulcer.    LABORATORY PANEL:   CBC  Recent Labs Lab 08/29/15 0536  WBC 10.6  HGB 10.3*  HCT 31.8*  PLT 458*   ------------------------------------------------------------------------------------------------------------------  Chemistries   Recent Labs Lab 08/28/15 1425 08/29/15 0536  NA 140 143  K 5.3* 4.2  CL 108 114*  CO2 24 23  GLUCOSE 98 93  BUN 29* 26*  CREATININE 1.30* 1.25*  CALCIUM 9.2 8.5*  AST 18  --   ALT 13*  --   ALKPHOS 62  --   BILITOT 0.6  --    ------------------------------------------------------------------------------------------------------------------  Cardiac Enzymes  Recent Labs Lab 08/29/15 0536  TROPONINI 0.04*   ------------------------------------------------------------------------------------------------------------------  RADIOLOGY:  Dg Abd Acute W/chest  08/28/2015  CLINICAL DATA:  Known C. difficile infection with abdominal pain EXAM: DG ABDOMEN ACUTE W/ 1V CHEST COMPARISON:  None. FINDINGS: Cardiac shadow is within normal limits. The lungs are well aerated bilaterally. Diffuse interstitial changes and scarring are noted. No acute infiltrate is seen. The abdomen demonstrates scattered large and small bowel gas. No obstructive changes are seen. Degenerative changes of the lumbar spine are noted. Aortic calcifications are seen. Changes of prior fixation of the proximal left femur are noted. IMPRESSION: No acute abnormality in the chest and abdomen. Electronically Signed   By: Eulah Pont.D.  On: 08/28/2015 13:34    EKG:   Orders placed or  performed during the hospital encounter of 08/28/15  . ED EKG  . ED EKG    ASSESSMENT AND PLAN:   82 year old Caucasian female history of dementia as well as recent C. difficile infection found being atrial fibrillation rapid ventricular response  1. Atrial fibrillation rapid ventricular response:  Received Cardizem bolus/Cardizem drip In emergency department continue drip goal heart rate less than 120 and wean off as tolerated  Appreciate cardiology recommendations  Normal TSH  echocardiogram results are pending,  cardiac enzymes 0.06, 0.05 and 0.04, on telemetry [place] score 1 given age but high risk for falls will avoid chronic anticoagulation for now  2. Elevated troponin: Likely demand ischemia given atrial fibrillation   on telemetry Nontrending cardiac enzymes  continue with aspirin  3. Hyperkalemia: 5.3-4.2 Resolved with IV fluid hydration.   4. Essential hypertension: Valsartan, monitor and titrate as needed  5. C. difficile infection prior to arrival: Continue oral Flagyl  6. Venous thrombus embolism prophylactic: Heparin subcutaneous  Generalized weakness PT consult is placed     All the records are reviewed and case discussed with Care Management/Social Workerr. Management plans discussed with the patient, family and they are in agreement.  CODE STATUS: fc   TOTAL TIME TAKING CARE OF THIS PATIENT: 35  minutes.   POSSIBLE D/C IN 1-2 DAYS, DEPENDING ON CLINICAL CONDITION.   Ramonita Lab M.D on 08/29/2015 at 1:01 PM  Between 7am to 6pm - Pager - [phone] After 6pm go to [email or web address] - password [email or web address]  [place] [facility]  Office  [phone]  CC: Primary care physician; No PCP Per Patient

## 2015-08-29 NOTE — Progress Notes (Signed)
Iso for c diff. A fib. Room air. Takes meds ok. Pt has not reported any pain. CXr neg. ECHO completed. Incontinent. Pt has no further concerns at this time.

## 2015-08-30 DIAGNOSIS — I480 Paroxysmal atrial fibrillation: Secondary | ICD-10-CM | POA: Diagnosis not present

## 2015-08-30 LAB — GLUCOSE, CAPILLARY: Glucose-Capillary: 89 mg/dL (ref 65–99)

## 2015-08-30 MED ORDER — ENOXAPARIN SODIUM 30 MG/0.3ML ~~LOC~~ SOLN
30.0000 mg | SUBCUTANEOUS | Status: DC
  Administered 2015-08-30: 30 mg via SUBCUTANEOUS
  Filled 2015-08-30: qty 0.3

## 2015-08-30 MED ORDER — METOPROLOL TARTRATE 25 MG PO TABS
25.0000 mg | ORAL_TABLET | Freq: Two times a day (BID) | ORAL | Status: DC
  Administered 2015-08-30 – 2015-08-31 (×3): 25 mg via ORAL
  Filled 2015-08-30 (×3): qty 1

## 2015-08-30 NOTE — [place] FL2 (Signed)
[place] MEDICAID FL2 LEVEL OF CARE SCREENING TOOL     IDENTIFICATION  Patient Name: Adrienne Humphrey Birthdate: 06/25/1921 Sex: female Admission Date (Current Location): 08/28/2015  [facility] and [place] Number:  Chiropodist and Address:  [facility], [address], [place], [place] [address]      Provider Number: [id number]  Attending Physician Name and Address:  Ramonita Lab, MD  Relative Name and Phone Number:       Current Level of Care: Hospital Recommended Level of Care: Assisted Living Facility Prior Approval Number:    Date Approved/Denied:   PASRR Number:    Discharge Plan: Domiciliary (Rest home)    Current Diagnoses: Patient Active Problem List   Diagnosis Date Noted  . Atrial fibrillation with rapid ventricular response (HCC) 08/28/2015  . Elevated troponin I level 08/28/2015  . Hyperkalemia 08/28/2015  . C. difficile colitis 08/20/2015    Orientation RESPIRATION BLADDER Height & Weight     Self  Normal Incontinent Weight: 119 lb 14.4 oz (54.386 kg) Height:   (167.6 cm)  BEHAVIORAL SYMPTOMS/MOOD NEUROLOGICAL BOWEL NUTRITION STATUS   (None )  (None ) Incontinent Diet (Heart )  AMBULATORY STATUS COMMUNICATION OF NEEDS Skin   Extensive Assist Verbally Normal                       Personal Care Assistance Level of Assistance    Bathing Assistance: Limited assistance Feeding assistance: Independent Dressing Assistance: Limited assistance     Functional Limitations Info  Sight, Hearing, Speech Sight Info: Impaired Hearing Info: Impaired Speech Info: Adequate    SPECIAL CARE FACTORS FREQUENCY  PT (By licensed PT)     PT Frequency:  (2/3 times a week)              Contractures      Additional Factors Info  Code Status, Allergies, Isolation Precautions Code Status Info:  (Full Code) Allergies Info:  (Ace Inhibitors, Sulfa Antibiotics)     Isolation Precautions Info:  (Enteric precautions (UV  disinfection))     Current Medications (08/30/2015):  This is the current hospital active medication list Current Facility-Administered Medications  Medication Dose Route Frequency Provider Last Rate Last Dose  . 0.9 %  sodium chloride infusion   Intravenous Continuous Wyatt Haste, MD 75 mL/hr at 08/29/15 0554    . acetaminophen (TYLENOL) tablet 650 mg  650 mg Oral Q6H PRN Wyatt Haste, MD       Or  . acetaminophen (TYLENOL) suppository 650 mg  650 mg Rectal Q6H PRN Wyatt Haste, MD      . aspirin chewable tablet 81 mg  81 mg Oral Daily Wyatt Haste, MD   81 mg at 08/30/15 1107  . brimonidine (ALPHAGAN) 0.2 % ophthalmic solution 1 drop  1 drop Both Eyes BID Wyatt Haste, MD   1 drop at 08/30/15 1110  . cholecalciferol (VITAMIN D) tablet 2,000 Units  2,000 Units Oral Daily Wyatt Haste, MD   2,000 Units at 08/30/15 1107  . clopidogrel (PLAVIX) tablet 75 mg  75 mg Oral Daily Wyatt Haste, MD   75 mg at 08/30/15 1108  . enoxaparin (LOVENOX) injection 30 mg  30 mg Subcutaneous Q24H Carman Essick, MD      . irbesartan (AVAPRO) tablet 75 mg  75 mg Oral Daily Wyatt Haste, MD   75 mg at 08/30/15 1107  . latanoprost (XALATAN) 0.005 % ophthalmic solution  1 drop  1 drop Both Eyes QHS Wyatt Haste, MD   1 drop at 08/29/15 2057  . liver oil-zinc oxide (DESITIN) 40 % ointment   Topical PRN Wyatt Haste, MD      . megestrol (MEGACE) 400 MG/10ML suspension 400 mg  400 mg Oral BID Wyatt Haste, MD   400 mg at 08/30/15 1108  . metoprolol tartrate (LOPRESSOR) tablet 25 mg  25 mg Oral BID Ramonita Lab, MD   25 mg at 08/30/15 1108  . metroNIDAZOLE (FLAGYL) tablet 500 mg  500 mg Oral TID Wyatt Haste, MD   500 mg at 08/30/15 1107  . mirtazapine (REMERON) tablet 7.5 mg  7.5 mg Oral QHS Wyatt Haste, MD   7.5 mg at 08/29/15 2056  . morphine 2 MG/ML injection 2 mg  2 mg Intravenous Q4H PRN Wyatt Haste, MD      . multivitamin with minerals tablet 1 tablet  1 tablet Oral Daily Wyatt Haste, MD   1 tablet  at 08/30/15 1108  . ondansetron (ZOFRAN) tablet 4 mg  4 mg Oral Q6H PRN Wyatt Haste, MD       Or  . ondansetron [facility]) injection 4 mg  4 mg Intravenous Q6H PRN Wyatt Haste, MD      . oxyCODONE (Oxy IR/ROXICODONE) immediate release tablet 5 mg  5 mg Oral Q4H PRN Wyatt Haste, MD      . pantoprazole (PROTONIX) EC tablet 40 mg  40 mg Oral Daily Wyatt Haste, MD   40 mg at 08/30/15 1108  . polyvinyl alcohol (LIQUIFILM TEARS) 1.4 % ophthalmic solution 1 drop  1 drop Both Eyes QID PRN Wyatt Haste, MD      . sodium chloride flush (NS) 0.9 % injection 3 mL  3 mL Intravenous Q12H Wyatt Haste, MD   3 mL at 08/30/15 1111  . timolol (TIMOPTIC) 0.5 % ophthalmic solution 1 drop  1 drop Both Eyes Daily Wyatt Haste, MD   1 drop at 08/30/15 1109  . vitamin C (ASCORBIC ACID) tablet 500 mg  500 mg Oral BID Wyatt Haste, MD   500 mg at 08/30/15 1108     Discharge Medications: Please see discharge summary for a list of discharge medications.  Relevant Imaging Results:  Relevant Lab Results:   Additional Information  (SSN [id number])  Verta Ellen Sunkins, LCSW

## 2015-08-30 NOTE — Care Management Important Message (Signed)
Important Message  Patient Details  Name: Adrienne Humphrey MRN: [id number] Date of Birth: [birth year]   Medicare Important Message Given:  Yes    Eber Hong, RN 08/30/2015, 3:06 PM

## 2015-08-30 NOTE — Progress Notes (Signed)
[facility] [place] [facility]   PATIENT NAME: Adrienne Humphrey    MR#:  [id number]  DATE OF BIRTH:  05-Feb-1921  SUBJECTIVE:  CHIEF COMPLAINT:  Patient is very lethargic this morning. Reports that she is very tired as she didn't sleep properly last night. Husband is at bedside. Denies any palpitations or shortness of breath. Telemetry monitor with atrial fibrillation heart rate 100-110 REVIEW OF SYSTEMS:   Patient is very lethargic, review of system unobtainable DRUG ALLERGIES:   Allergies  Allergen Reactions  . Ace Inhibitors Other (See Comments)    Reaction:  Unknown   . Sulfa Antibiotics Other (See Comments)    Reaction:  Unknown     VITALS:  Blood pressure 127/93, pulse 69, temperature 98.2 F (36.8 C), temperature source Oral, resp. rate 19, height  (1.676 m), weight 54.386 kg (119 lb 14.4 oz), SpO2 96 %.  PHYSICAL EXAMINATION:  GENERAL:  82 y.o.-year-old patient lying in the bed with no acute distress.  EYES: Pupils equal, round, reactive to light and accommodation. No scleral icterus. Extraocular muscles intact.  HEENT: Head atraumatic, normocephalic. Oropharynx and nasopharynx clear.  NECK:  Supple, no jugular venous distention. No thyroid enlargement, no tenderness.  LUNGS: Normal breath sounds bilaterally, no wheezing, rales,rhonchi or crepitation. No use of accessory muscles of respiration.  CARDIOVASCULAR: Irregularly irregular, No murmurs, rubs, or gallops.  ABDOMEN: Soft, nontender, nondistended. Bowel sounds present. No organomegaly or mass.  EXTREMITIES: No pedal edema, cyanosis, or clubbing.  NEUROLOGIC: Patient is lethargic.  PSYCHIATRIC: The patient is lethargic SKIN: No obvious rash, lesion, or ulcer.    LABORATORY PANEL:   CBC  Recent Labs Lab 08/29/15 0536  WBC 10.6  HGB 10.3*  HCT 31.8*  PLT 458*    ------------------------------------------------------------------------------------------------------------------  Chemistries   Recent Labs Lab 08/28/15 1425 08/29/15 0536  NA 140 143  K 5.3* 4.2  CL 108 114*  CO2 24 23  GLUCOSE 98 93  BUN 29* 26*  CREATININE 1.30* 1.25*  CALCIUM 9.2 8.5*  AST 18  --   ALT 13*  --   ALKPHOS 62  --   BILITOT 0.6  --    ------------------------------------------------------------------------------------------------------------------  Cardiac Enzymes  Recent Labs Lab 08/29/15 0536  TROPONINI 0.04*   ------------------------------------------------------------------------------------------------------------------  RADIOLOGY:  No results found.  EKG:   Orders placed or performed during the hospital encounter of 08/28/15  . ED EKG  . ED EKG    ASSESSMENT AND PLAN:   82 year old Caucasian female history of dementia as well as recent C. difficile infection found being atrial fibrillation rapid ventricular response  1. Atrial fibrillation rapid ventricular response:  Received Cardizem bolus/Cardizem drip weaned off  Started patient on metoprolol 25 mg by mouth twice a day  Appreciate cardiology recommendations  Normal TSH  echocardiogram results are pending  cardiac enzymes 0.06, 0.05 and 0.04, on telemetry [place] score 1 given age but high risk for falls will avoid chronic anticoagulation for now  2. Elevated troponin: Likely demand ischemia given atrial fibrillation   on telemetry Nontrending cardiac enzymes  continue with aspirin  3. Hyperkalemia: 5.3-4.2 Resolved with IV fluid hydration.   4. Essential hypertension: Valsartan, monitor and titrate as needed  5. C. difficile infection prior to arrival: Continue oral Flagyl  6. Venous thrombus embolism prophylactic: Heparin subcutaneous  Generalized weakness PT consult is placed     All the records are reviewed and case discussed with Care Management/Social  Workerr. Management plans discussed with the patient,  family and they are in agreement.  CODE STATUS: fc   TOTAL TIME TAKING CARE OF THIS PATIENT: 35  minutes.   POSSIBLE D/C IN AM  DAYS, DEPENDING ON CLINICAL CONDITION.   Ramonita Lab M.D on 08/30/2015 at 2:48 PM  Between 7am to 6pm - Pager - [phone] After 6pm go to [email or web address] - password [email or web address] [facility]  [facility]  Office  [phone]  CC: Primary care physician; No PCP Per Patient

## 2015-08-30 NOTE — Progress Notes (Signed)
SNF and EMS Benefit:  Number called: [phone] Rep: Raven Reference Number: [id number]  Patient has a [employer] Health Plan, a plan administered by [facility] the [place].  Per rep, [place] is secondary due to patient having Medicare as primary (Part B only).  Per rep, they do not provide a benefit for SNF due to this.    For EMS non-emergent transports - Part B would be primary as well and [employer] would cover the 20% not covered by Medicare.  No precert required.

## 2015-08-30 NOTE — Progress Notes (Signed)
[facility] Encounter Note  Patient: Adrienne Humphrey / Admit Date: 08/28/2015 / Date of Encounter: 08/30/2015, 4:54 PM   Subjective: Patient has dementia and is unable to voice any significant concerns at this time but no evidence of distress at this time  Review of Systems:  Unable to assess Objective: Telemetry: Atrial fibrillation with variable heart rate Physical Exam: Blood pressure 127/93, pulse 69, temperature 98.2 F (36.8 C), temperature source Oral, resp. rate 19, height  (1.676 m), weight 119 lb 14.4 oz (54.386 kg), SpO2 96 %. Body mass index is 19.36 kg/(m^2). General: Well developed, well nourished, in no acute distress. Head: Normocephalic, atraumatic, sclera non-icteric, no xanthomas, nares are without discharge. Neck: No apparent masses Lungs: Normal respirations with no wheezes, no rhonchi, no rales , no crackles   Heart: Irregular rate and rhythm, normal S1 S2, 2+ apical murmur, no rub, no gallop, PMI is normal size and placement, carotid upstroke normal without bruit, jugular venous pressure normal Abdomen: Soft, non-tender, non-distended with normoactive bowel sounds. No hepatosplenomegaly. Abdominal aorta is normal size without bruit Extremities: Trace to 1+ edema, no clubbing, no cyanosis, no ulcers,  Peripheral: 2+ radial, 2+ femoral, 2+ dorsal pedal pulses Neuro: Alert and oriented. Moves all extremities spontaneously.    Intake/Output Summary (Last 24 hours) at 08/30/15 1654 Last data filed at 08/30/15 0820  Gross per 24 hour  Intake 1297.5 ml  Output      1 ml  Net 1296.5 ml    Inpatient Medications:  . aspirin  81 mg Oral Daily  . brimonidine  1 drop Both Eyes BID  . cholecalciferol  2,000 Units Oral Daily  . clopidogrel  75 mg Oral Daily  . enoxaparin (LOVENOX) injection  30 mg Subcutaneous Q24H  . irbesartan  75 mg Oral Daily  . latanoprost  1 drop Both Eyes QHS  . megestrol  400 mg Oral BID  . metoprolol tartrate  25 mg  Oral BID  . metroNIDAZOLE  500 mg Oral TID  . mirtazapine  7.5 mg Oral QHS  . multivitamin with minerals  1 tablet Oral Daily  . pantoprazole  40 mg Oral Daily  . sodium chloride flush  3 mL Intravenous Q12H  . timolol  1 drop Both Eyes Daily  . vitamin C  500 mg Oral BID   Infusions:  . sodium chloride 75 mL/hr at 08/29/15 0554    Labs:  Recent Labs  08/28/15 1425 08/29/15 0536  NA 140 143  K 5.3* 4.2  CL 108 114*  CO2 24 23  GLUCOSE 98 93  BUN 29* 26*  CREATININE 1.30* 1.25*  CALCIUM 9.2 8.5*    Recent Labs  08/28/15 1425  AST 18  ALT 13*  ALKPHOS 62  BILITOT 0.6  PROT 6.8  ALBUMIN 3.4*    Recent Labs  08/28/15 1425 08/29/15 0536  WBC 14.5* 10.6  NEUTROABS 11.5*  --   HGB 12.3 10.3*  HCT 38.0 31.8*  MCV 86.6 87.5  PLT 521* 458*    Recent Labs  08/28/15 1425 08/28/15 1757 08/28/15 2304 08/29/15 0536  TROPONINI 0.07* 0.06* 0.05* 0.04*   Invalid input(s): POCBNP No results for input(s): HGBA1C in the last 72 hours.   Weights: Filed Weights   08/28/15 1254 08/28/15 1727  Weight: 120 lb (54.432 kg) 119 lb 14.4 oz (54.386 kg)     Radiology/Studies:  Dg Chest Portable 1 View  08/20/2015  CLINICAL DATA:  Weakness.  Diarrhea.  Cough and  fever. EXAM: PORTABLE CHEST - 1 VIEW COMPARISON:  One-view chest x-ray 07/03/2013 FINDINGS: The heart size is normal. Atherosclerotic calcifications are present at the aortic arch. Emphysema is noted. The visualized soft tissues and bony thorax are unremarkable. IMPRESSION: 1. No acute cardiopulmonary disease. 2. Atherosclerosis. 3. Emphysema. Electronically Signed   By: Marin Roberts M.D.   On: 08/20/2015 15:46   Dg Abd Acute W/chest  08/28/2015  CLINICAL DATA:  Known C. difficile infection with abdominal pain EXAM: DG ABDOMEN ACUTE W/ 1V CHEST COMPARISON:  None. FINDINGS: Cardiac shadow is within normal limits. The lungs are well aerated bilaterally. Diffuse interstitial changes and scarring are noted. No  acute infiltrate is seen. The abdomen demonstrates scattered large and small bowel gas. No obstructive changes are seen. Degenerative changes of the lumbar spine are noted. Aortic calcifications are seen. Changes of prior fixation of the proximal left femur are noted. IMPRESSION: No acute abnormality in the chest and abdomen. Electronically Signed   By: Alcide Clever M.D.   On: 08/28/2015 13:34     Assessment and Recommendation  82 y.o. female with chronic kidney disease stage III and atrial fibrillation with rapid ventricular rate now more controlled on metoprolol with an elevated troponin more consistent with demand ischemia rather than acute coronary syndrome 1. Continue metoprolol for heart rate control of atrial fibrillation with a goal heart rate between 60 and 90 bpm 2. No anticoagulation due to concerns of bleeding complications fall issues and other concerns 3. Further consideration of palliative care at this point with long discussion with family and concerns that the patient is not improving and does not want eat and is unable to rehabilitate 4. No further cardiac diagnostic testing and treatment options at this time necessary  Signed, Arnoldo Hooker M.D. FACC

## 2015-08-30 NOTE — Progress Notes (Signed)
PT Cancellation Note  Patient Details Name: Adrienne Humphrey MRN: [id number] DOB: 17-Dec-1920   Cancelled Treatment:    Reason Eval/Treat Not Completed: Other (comment). Pt very lethargic upon arrival, however able to arouse with verbal cues. Pt is refusing therapy evaluation at this time. Will re-attempt at different time.   Sumire Halbleib 08/30/2015, 8:54 AM  Elizabeth Palau, PT, DPT [phone]

## 2015-08-30 NOTE — Clinical Social Work Note (Signed)
Clinical Social Work Assessment  Patient Details  Name: Rachelle Edwards Schollmeyer MRN: [id number] Date of Birth: [birth year]  Date of referral:  08/30/15               Reason for consult:  Discharge Planning                Permission sought to share information with:  Family Supports, Magazine features editor Permission granted to share information::  Yes, Verbal Permission Granted  Name::        Agency::   [facility] )  Relationship::   Tasia Catchings [place]liver (Spouse) & Lyda Perone Tunnell (Son) )  Contact Information:     Housing/Transportation Living arrangements for the past 2 months:  Assisted Living Facility [facility] ) Source of Information:  Adult Children Lyda Perone Tonkinson (Son) ) Patient Interpreter Needed:  None Criminal Activity/Legal Involvement Pertinent to Current Situation/Hospitalization:  No - Comment as needed Significant Relationships:  Adult Children, Spouse Lives with:  Facility Resident, Spouse Do you feel safe going back to the place where you live?  Yes Need for family participation in patient care:  Yes (Comment) Tasia Catchings [place]ley (Spouse) & Lyda Perone Delpilar (Son) )  Care giving concerns:  Patient is a resident at [facility]    Social Worker assessment / plan: CSW was informed that patient is a resident at [facility]. Per PT patient is recommended for SNF placement. CSW went to patient's room. Patient was asleep and not easily waken. CSW attempted to contact patients husband, Tasia Catchings Mastro by the number on face sheet. The number listed is disconnected. CSW contacted patient's son Lyda Perone Bitner. Per Lyda Perone patient is a resident at [facility] where she lives with her husband Tasia Catchings Rothert. HE reports that he believes that patient will return at discharge. CSW explained that PT recommended SNF for placement. CSW explained that patient does not have a SNF benefit with her insurance plan. Lyda Perone stated that he would give this information to his father, Tasia Catchings and have  Tasia Catchings call CSW back. CSW provided her telephone number. CSW is awaiting phone call back.    FL2 / PASRR completed and faxed to [facility] via HUB. CSW will contineu to follow and assist.  Employment status:  Retired Community education officer information:  Other (Comment Required), Medicare ([employer] High Options Plan and Medicare Part B ONLY) PT Recommendations:  Skilled Nursing Facility Information / Referral to community resources:  Skilled Nursing Facility  Patient/Family's Response to care:  Patient's son is agreeable for patient to return to [place] at discharge.   Patient/Family's Understanding of and Emotional Response to Diagnosis, Current Treatment, and Prognosis: Per Patient's son he understands CSW's role and appreciates her assistnace. He sated he would pass CSW's information to his father, Tasia Catchings. CSW will follow up with patient's husband Tasia Catchings tomorrow.   Emotional Assessment Appearance:  Appears stated age Attitude/Demeanor/Rapport:   (None ) Affect (typically observed):  Calm, Pleasant Orientation:  Oriented to Self Alcohol / Substance use:  Not Applicable Psych involvement (Current and /or in the community):  No (Comment)  Discharge Needs  Concerns to be addressed:  Discharge Planning Concerns Readmission within the last 30 days:  Yes Current discharge risk:  Chronically ill Barriers to Discharge:  Continued Medical Work up   [place], LCSW 08/30/2015, 4:18 PM

## 2015-08-31 DIAGNOSIS — I480 Paroxysmal atrial fibrillation: Secondary | ICD-10-CM | POA: Diagnosis not present

## 2015-08-31 LAB — BASIC METABOLIC PANEL
Anion gap: 6 (ref 5–15)
BUN: 27 mg/dL — AB (ref 6–20)
CALCIUM: 8.5 mg/dL — AB (ref 8.9–10.3)
CO2: 22 mmol/L (ref 22–32)
CREATININE: 1.22 mg/dL — AB (ref 0.44–1.00)
Chloride: 109 mmol/L (ref 101–111)
GFR calc Af Amer: 43 mL/min — ABNORMAL LOW (ref >60)
GFR, EST NON AFRICAN AMERICAN: 37 mL/min — AB (ref >60)
GLUCOSE: 94 mg/dL (ref 65–99)
Potassium: 4.2 mmol/L (ref 3.5–5.1)
Sodium: 137 mmol/L (ref 135–145)

## 2015-08-31 MED ORDER — METOPROLOL TARTRATE 25 MG PO TABS: 25.0000 mg | ORAL_TABLET | Freq: Two times a day (BID) | ORAL | Status: AC

## 2015-08-31 MED ORDER — METRONIDAZOLE 500 MG PO TABS: 500.0000 mg | ORAL_TABLET | Freq: Three times a day (TID) | ORAL | Status: AC

## 2015-08-31 NOTE — Care Management (Signed)
Dr. Gouru texted for OPPT [facility] back [facility]. CSW updated.

## 2015-08-31 NOTE — Progress Notes (Signed)
Report called to desk at [facility].  P{t will transport via EMS back to [facility].

## 2015-08-31 NOTE — Evaluation (Signed)
Physical Therapy Evaluation Patient Details Name: Adrienne Humphrey MRN: [id number] DOB: 01/14/1921 Today's Date: 08/31/2015   History of Present Illness  Pt admitted for afib with RVR. Pt with history of dementia and hyperlipidemia. Pt from [facility] with husband.   Clinical Impression  Pt is a pleasant 82 year old female who was admitted for afib with RVR. Pt performs bed mobility and transfers with max assist and HHA. Pt unable to use RW at this time and is unable to further perform mobility at this time. Pt demonstrates deficits with strength/endurance. Per husband, pt was able to stand with assist and transfer to [facility]. Pt is unable to do this at this time and is not at baseline level. Would benefit from skilled PT to address above deficits and promote optimal return to PLOF.      Follow Up Recommendations SNF    Equipment Recommendations       Recommendations for Other Services       Precautions / Restrictions Precautions Precautions: Fall Restrictions Weight Bearing Restrictions: No      Mobility  Bed Mobility Overal bed mobility: Needs Assistance Bed Mobility: Supine to Sit     Supine to sit: Max assist     General bed mobility comments: assist for B LE sliding off bed and scooting out towards EOB. Once seated at EOB, pt with multiple LOB in post direction with min assist for correction. Pt not able to maintain balance independently  Transfers Overall transfer level: Needs assistance Equipment used: 1 person hand held assist Transfers: Sit to/from Stand Sit to Stand: Max assist         General transfer comment: assist for blocking knees as they started buckling during transfer. Stand transfer performed with max assist. Pt unable to maintain standing and sat back on bed after 2 seconds. Unable to progress further mobility  Ambulation/Gait             General Gait Details: unable   Stairs            Wheelchair Mobility    Modified Rankin  (Stroke Patients Only)       Balance Overall balance assessment: Needs assistance Sitting-balance support: Feet supported Sitting balance-Leahy Scale: Poor     Standing balance support: Bilateral upper extremity supported (on therapist) Standing balance-Leahy Scale: Poor                               Pertinent Vitals/Pain Pain Assessment: No/denies pain    Home Living Family/patient expects to be discharged to:: Assisted living               Home Equipment: Walker - 2 wheels;Wheelchair - manual      Prior Function Level of Independence: Needs assistance         Comments: mainly doing transfers to [facility], was able to walk with RW with PT at ALF     Hand Dominance        Extremity/Trunk Assessment   Upper Extremity Assessment: Generalized weakness (grossly 3/5)           Lower Extremity Assessment: Generalized weakness (grossly 3/5)         Communication   Communication: No difficulties  Cognition Arousal/Alertness: Awake/alert Behavior During Therapy: WFL for tasks assessed/performed Overall Cognitive Status: Impaired/Different from baseline  General Comments      Exercises        Assessment/Plan    PT Assessment Patient needs continued PT services  PT Diagnosis Difficulty walking;Abnormality of gait;Generalized weakness   PT Problem List Decreased strength;Decreased balance;Decreased mobility;Decreased coordination  PT Treatment Interventions Gait training;Therapeutic exercise   PT Goals (Current goals can be found in the Care Plan section) Acute Rehab PT Goals Patient Stated Goal: to get stronger PT Goal Formulation: With patient Time For Goal Achievement: 09/14/15 Potential to Achieve Goals: Good    Frequency Min 2X/week   Barriers to discharge        Co-evaluation               End of Session Equipment Utilized During Treatment: Gait belt Activity Tolerance: Patient limited by  fatigue;Patient limited by lethargy Patient left: in bed;with bed alarm set Nurse Communication: Mobility status         Time: 1610-9604 PT Time Calculation (min) (ACUTE ONLY): 27 min   Charges:   PT Evaluation $PT Eval Moderate Complexity: 1 Procedure     PT G Codes:        Kenzy Campoverde Sep 25, 2015, 3:42 PM  Elizabeth Palau, PT, DPT [phone]

## 2015-08-31 NOTE — Discharge Instructions (Signed)
Activity per PT recommendations Diet regular Follow-up with primary care physician at the facility in 2-3 days Follow-up with cardiology Dr. Gwen Pounds in a week, echocardiogram results are pending need to be followed up by cardiology

## 2015-08-31 NOTE — Discharge Summary (Signed)
[facility] [place] at [facility]   PATIENT NAME: Adrienne Humphrey    MR#:  [id number]  DATE OF BIRTH:  [birth year]  DATE OF ADMISSION:  08/28/2015 ADMITTING PHYSICIAN: Wyatt Haste, MD  DATE OF DISCHARGE: 08/31/2015 PRIMARY CARE PHYSICIAN: No PCP Per Patient    ADMISSION DIAGNOSIS:  Paroxysmal atrial fibrillation (HCC) [I48.0]  DISCHARGE DIAGNOSIS:  Principal Problem:   Atrial fibrillation with rapid ventricular response (HCC) Active Problems:   Elevated troponin I level   Hyperkalemia  C. difficile colitis SECONDARY DIAGNOSIS:   Past Medical History  Diagnosis Date  . Hyperlipidemia   . Dementia   . Macular degeneration   . Osteoporosis     HOSPITAL COURSE:   82 year old Caucasian female history of dementia as well as recent C. difficile infection found being atrial fibrillation rapid ventricular response  1. Atrial fibrillation rapid ventricular response:  Received Cardizem bolus/Cardizem drip weaned off  Started patient on metoprolol 25 mg by mouth twice a day , did well controlled Appreciate cardiology recommendations  Normal TSH echocardiogram results are pending outpatient follow-up with [facility] cardiology regarding the echo results, cardiac enzymes 0.06, 0.05 and 0.04, on telemetry [place] score 1 given age but high risk for falls will avoid chronic anticoagulation for now  2. Elevated troponin: Likely demand ischemia given atrial fibrillation  on telemetry Nontrending cardiac enzymes  continue with aspirin  3. Hyperkalemia: 5.3-4.2 Resolved with IV fluid hydration.  4. Essential hypertension: Valsartan, monitor and titrate as needed  5. C. difficile infection prior to arrival: Continue oral Flagyl to complete the course of antibiotic   6. Poor by mouth intake-family has refused palliative care consult in-house They would like to follow up with palliative care as an outpatient at facility   husband is considering comfort care if  no improvement in her clinical situation, refusing PEG tube    6. Venous thrombus embolism prophylactic: Heparin subcutaneous  DISCHARGE CONDITIONS:   fair  CONSULTS OBTAINED:  Treatment Team:  Wyatt Haste, MD Lamar Blinks, MD   PROCEDURES none  DRUG ALLERGIES:   Allergies  Allergen Reactions  . Ace Inhibitors Other (See Comments)    Reaction:  Unknown   . Sulfa Antibiotics Other (See Comments)    Reaction:  Unknown     DISCHARGE MEDICATIONS:   Current Discharge Medication List    START taking these medications   Details  metoprolol tartrate (LOPRESSOR) 25 MG tablet Take 1 tablet (25 mg total) by mouth 2 (two) times daily. Qty: 60 tablet, Refills: 0      CONTINUE these medications which have CHANGED   Details  metroNIDAZOLE (FLAGYL) 500 MG tablet Take 1 tablet (500 mg total) by mouth 3 (three) times daily. Qty: 15 tablet, Refills: 0      CONTINUE these medications which have NOT CHANGED   Details  aspirin 81 MG chewable tablet Chew 81 mg by mouth daily.    brimonidine (ALPHAGAN) 0.2 % ophthalmic solution Place 1 drop into both eyes 2 (two) times daily.    carboxymethylcellulose 1 % ophthalmic solution Place 2 drops into both eyes 2 (two) times daily.    Cholecalciferol (VITAMIN D) 2000 UNITS CAPS Take 2,000 Units by mouth daily.    clopidogrel (PLAVIX) 75 MG tablet Take 75 mg by mouth daily.    docusate calcium (SURFAK) 240 MG capsule Take 240 mg by mouth at bedtime.    Eyelid Cleansers (OCUSOFT LID SCRUB EX) Place 1 application into both eyes every morning.  Apply to lids.    megestrol (MEGACE) 400 MG/10ML suspension Take 10 mLs (400 mg total) by mouth 2 (two) times daily. Qty: 240 mL, Refills: 0    mirtazapine (REMERON) 15 MG tablet Take 7.5 mg by mouth daily.    Multiple Vitamin (MULTIVITAMIN WITH MINERALS) TABS tablet Take 1 tablet by mouth daily.    pantoprazole (PROTONIX) 40 MG tablet Take 40 mg by mouth daily.    polyvinyl alcohol  (ARTIFICIAL TEARS) 1.4 % ophthalmic solution Place 1 drop into both eyes 4 (four) times daily.    Skin Protectants, Misc. (CALAZIME SKIN PROTECTANT) PSTE Apply 1 application topically as needed (with each diaper change).    timolol (TIMOPTIC) 0.5 % ophthalmic solution Place 1 drop into both eyes every morning.     Travoprost, BAK Free, (TRAVATAN) 0.004 % SOLN ophthalmic solution Place 1 drop into both eyes at bedtime.    valsartan (DIOVAN) 80 MG tablet Take 80 mg by mouth daily.    vitamin C (ASCORBIC ACID) 500 MG tablet Take 500 mg by mouth 2 (two) times daily.    White Petrolatum-Mineral Oil (ARTIFICIAL TEARS) OINT ophthalmic ointment Place 1 application into both eyes at bedtime.         DISCHARGE INSTRUCTIONS:    Activity per PT recommendations Diet regular Follow-up with primary care physician at the facility in 2-3 days Follow-up with cardiology Dr. Gwen Pounds in a week, echocardiogram results are pending need to be followed up by cardiology Follow-up with palliative care in a week  DIET:  Regular diet  DISCHARGE CONDITION:  Fair  ACTIVITY:  Activity as tolerated  OXYGEN:  Home Oxygen: No.   Oxygen Delivery: room air  DISCHARGE LOCATION:  nursing home   If you experience worsening of your admission symptoms, develop shortness of breath, life threatening emergency, suicidal or homicidal thoughts you must seek medical attention immediately by calling 911 or calling your MD immediately  if symptoms less severe.  You Must read complete instructions/literature along with all the possible adverse reactions/side effects for all the Medicines you take and that have been prescribed to you. Take any new Medicines after you have completely understood and accpet all the possible adverse reactions/side effects.   Please note  You were cared for by a hospitalist during your hospital stay. If you have any questions about your discharge medications or the care you received while  you were in the hospital after you are discharged, you can call the unit and asked to speak with the hospitalist on call if the hospitalist that took care of you is not available. Once you are discharged, your primary care physician will handle any further medical issues. Please note that NO REFILLS for any discharge medications will be authorized once you are discharged, as it is imperative that you return to your primary care physician (or establish a relationship with a primary care physician if you do not have one) for your aftercare needs so that they can reassess your need for medications and monitor your lab values.     Today  Chief Complaint  Patient presents with  . Generalized Body Aches    patient is tired and sleeping today morning. According to the family members she wakes up in the afternoon as she is getting disturbed sleep during nights from vital sign checks .husband is concerned about her poor po intake which is progressively getting worse .considering palliative care consult at facility .  ROS:  limited  RESPIRATORY: Denies cough, wheeze, shortness of  breath.  CARDIOVASCULAR: Denies chest pain, palpitations, edema.  GASTROINTESTINAL: Denies nausea, vomiting, diarrhea, abdominal pain. Denies bright red blood per rectum. MUSCULOSKELETAL: Denies pain in neck, back, shoulder, knees, hips or arthritic symptoms.    VITAL SIGNS:  Blood pressure 132/87, pulse 82, temperature 97.9 F (36.6 C), temperature source Oral, resp. rate 20, height  (1.676 m), weight 54.386 kg (119 lb 14.4 oz), SpO2 91 %.  I/O:   Intake/Output Summary (Last 24 hours) at 08/31/15 1424 Last data filed at 08/31/15 1421  Gross per 24 hour  Intake 2302.5 ml  Output      0 ml  Net 2302.5 ml    PHYSICAL EXAMINATION:  GENERAL:  82 y.o.-year-old patient lying in the bed with no acute distress.  EYES: Pupils equal, round, reactive to light and accommodation. No scleral icterus. Extraocular muscles  intact.  HEENT: Head atraumatic, normocephalic. Oropharynx and nasopharynx clear.  NECK:  Supple, no jugular venous distention. No thyroid enlargement, no tenderness.  LUNGS: Normal breath sounds bilaterally, no wheezing, rales,rhonchi or crepitation. No use of accessory muscles of respiration.  CARDIOVASCULAR: Irregularly irregular. No murmurs, rubs, or gallops.  ABDOMEN: Soft, non-tender, non-distended. Bowel sounds present. No organomegaly or mass.  EXTREMITIES: No pedal edema, cyanosis, or clubbing.  NEUROLOGIC: strength at her baseline  Gait not checked.  PSYCHIATRIC: The patient is  sleeping but arousable and oriented 2  SKIN: No obvious rash, lesion, or ulcer.   DATA REVIEW:   CBC  Recent Labs Lab 08/29/15 0536  WBC 10.6  HGB 10.3*  HCT 31.8*  PLT 458*    Chemistries   Recent Labs Lab 08/28/15 1425  08/31/15 0517  NA 140  < > 137  K 5.3*  < > 4.2  CL 108  < > 109  CO2 24  < > 22  GLUCOSE 98  < > 94  BUN 29*  < > 27*  CREATININE 1.30*  < > 1.22*  CALCIUM 9.2  < > 8.5*  AST 18  --   --   ALT 13*  --   --   ALKPHOS 62  --   --   BILITOT 0.6  --   --   < > = values in this interval not displayed.  Cardiac Enzymes  Recent Labs Lab 08/29/15 0536  TROPONINI 0.04*    Microbiology Results  Results for orders placed or performed during the hospital encounter of 08/20/15  C difficile quick scan w PCR reflex     Status: Abnormal   Collection Time: 08/20/15  3:49 PM  Result Value Ref Range Status   C Diff antigen POSITIVE (A) NEGATIVE Final   C Diff toxin POSITIVE (A) NEGATIVE Final   C Diff interpretation   Final    Positive for toxigenic C. difficile, active toxin production present.    Comment: CRITICAL RESULT CALLED TO, READ BACK BY AND VERIFIED WITH: DONALD SWEENEY,RN 08/20/2015 1658 BY JRS.   MRSA PCR Screening     Status: None   Collection Time: 08/21/15  6:20 AM  Result Value Ref Range Status   MRSA by PCR NEGATIVE NEGATIVE Final    Comment:         The GeneXpert MRSA Assay (FDA approved for NASAL specimens only), is one component of a comprehensive MRSA colonization surveillance program. It is not intended to diagnose MRSA infection nor to guide or monitor treatment for MRSA infections.     RADIOLOGY:  Dg Abd Acute W/chest  08/28/2015  CLINICAL DATA:  Known C. difficile infection with abdominal pain EXAM: DG ABDOMEN ACUTE W/ 1V CHEST COMPARISON:  None. FINDINGS: Cardiac shadow is within normal limits. The lungs are well aerated bilaterally. Diffuse interstitial changes and scarring are noted. No acute infiltrate is seen. The abdomen demonstrates scattered large and small bowel gas. No obstructive changes are seen. Degenerative changes of the lumbar spine are noted. Aortic calcifications are seen. Changes of prior fixation of the proximal left femur are noted. IMPRESSION: No acute abnormality in the chest and abdomen. Electronically Signed   By: Alcide Clever M.D.   On: 08/28/2015 13:34    EKG:   Orders placed or performed during the hospital encounter of 08/28/15  . ED EKG  . ED EKG      Management plans discussed with the patient, family and they are in agreement.  CODE STATUS:     Code Status Orders        Start     Ordered   08/28/15 1546  Full code   Continuous     08/28/15 1545    Code Status History    Date Active Date Inactive Code Status Order ID Comments User Context   08/20/2015  6:49 PM 08/23/2015  5:34 PM Full Code [id number]  Enedina Finner, MD Inpatient    Advance Directive Documentation        Most Recent Value   Type of Advance Directive  Healthcare Power of Attorney, Living will   Pre-existing out of facility DNR order (yellow form or pink MOST form)     "MOST" Form in Place?        TOTAL TIME TAKING CARE OF THIS PATIENT: 45  minutes.    @  on 08/31/2015 at 2:24 PM  Between 7am to 6pm - Pager - [phone]  After 6pm go to [email or web address] - password [email or web address] [facility]  [facility]   Office  [phone]  CC: Primary care physician; No PCP Per Patient

## 2015-08-31 NOTE — Progress Notes (Signed)
Clinical Social Worker was informed that patient will be medically ready to discharge to [facility]. Patient and family are in a agreement with plan. CSW called [place], admissions RN at [facility] to confirm that patient's bed is ready.  All discharge information faxed to [facility] via [place] [facility] [place]. Rx's added to discharge packet.   Call to patient's husband Tasia Catchings [place], left message with nurse at [facility] (both patient and her husband live at [facility]) to inform him patient would discharge to [facility] today. RN will carrange [facility] transport.   Woodroe Mode, MSW, LCSW-A Clinical Social Work Department [phone]

## 2015-09-01 NOTE — Progress Notes (Signed)
   Aug 29, 2015 1520  PT Visit Information  Last PT Received On 2015-08-29  Assistance Needed +1  History of Present Illness Pt admitted for Cdiff with complaints of weakness/diarrhea. Pt with history of hyperlipidemia, dementia, and OA. Pt currently lives at ILF.  Precautions  Precautions Fall  Restrictions  Weight Bearing Restrictions No  Home Living  Family/patient expects to be discharged to: Assisted living  Home Equipment Walker - 2 wheels;Wheelchair - manual  Prior Function  Level of Independence Independent with assistive device(s)  Communication  Communication No difficulties  Pain Assessment  Pain Assessment No/denies pain  Cognition  Arousal/Alertness Awake/alert  Behavior During Therapy WFL for tasks assessed/performed  Overall Cognitive Status Within Functional Limits for tasks assessed  Upper Extremity Assessment  Upper Extremity Assessment Generalized weakness (grossly 3/5)  Lower Extremity Assessment  Lower Extremity Assessment Generalized weakness (grossly 4/5)  Bed Mobility  Overal bed mobility Needs Assistance  Bed Mobility Supine to Sit  Supine to sit Mod assist  General bed mobility comments assist for scooting towards EOB as well as trunk support. Once seated at EOB, pt able to sit with cga  Transfers  Overall transfer level Needs assistance  Equipment used Rolling walker (2 wheeled)  Transfers Sit to/from Stand  Sit to Stand Min assist  General transfer comment assist required for standing. Safe technique performed with cues for pushing from seated surface. Once standing, cues for upright posture.  Ambulation/Gait  Ambulation/Gait assistance Min assist  Ambulation Distance (Feet) 3 Feet  Assistive device Rolling walker (2 wheeled)  Gait Pattern/deviations Step-to pattern  General Gait Details ambulated to recliner with rw and assist for movement of AD as well as heavy cues for sequencing. Slow step to gait pattern performed. Pt able to follow commands well   Exercises  Exercises Other exercises  Other Exercises  Other Exercises Supine ther-ex performed including B LE ankle pumps, hip add squeezes, SLRs, and hip abd/add. All ther-ex performed x 10 reps with min assist and cues for technique  PT - End of Session  Equipment Utilized During Treatment Gait belt  Activity Tolerance Patient tolerated treatment well  Patient left in chair;with chair alarm set  Nurse Communication Mobility status  PT Assessment  PT Therapy Diagnosis  Difficulty walking;Generalized weakness  PT Recommendation/Assessment Patient needs continued PT services  PT Problem List Decreased strength;Decreased balance;Decreased mobility;Decreased coordination  PT Plan  PT Frequency (ACUTE ONLY) Min 2X/week  PT Treatment/Interventions (ACUTE ONLY) Gait training;Therapeutic exercise  PT Recommendation  Follow Up Recommendations SNF  Individuals Consulted  Consulted and Agree with Results and Recommendations Patient  Acute Rehab PT Goals  Patient Stated Goal to get stronger  PT Goal Formulation With patient  Time For Goal Achievement 09/04/15  Potential to Achieve Goals Good  PT Time Calculation  PT Start Time (ACUTE ONLY) 1231  PT Stop Time (ACUTE ONLY) 1259  PT Time Calculation (min) (ACUTE ONLY) 28 min  PT G-Codes **NOT FOR INPATIENT CLASS**  Functional Assessment Tool Used clinical judgement  Functional Limitation Mobility: Walking and moving around  Mobility: Walking and Moving Around Current Status (Z6109) CJ  Mobility: Walking and Moving Around Goal Status (U0454) CI  PT General Charges  $$ ACUTE PT VISIT 1 Procedure  PT Evaluation  $PT Eval Moderate Complexity 1 Procedure  PT Treatments  $Therapeutic Exercise 8-22 mins   Late entry G codes for 08/29/2015  Elizabeth Palau, PT, DPT [phone]

## 2015-09-29 DEATH — deceased

## 2015-10-06 NOTE — Progress Notes (Signed)
   08/31/15 1535  PT Visit Information  Last PT Received On 08/31/15  Assistance Needed +1  History of Present Illness Pt admitted for afib with RVR. Pt with history of dementia and hyperlipidemia. [facility].   Precautions  Precautions Fall  Restrictions  Weight Bearing Restrictions No  Home Living  Family/patient expects to be discharged to: Assisted living  Home Equipment Walker - 2 wheels;Wheelchair - manual  Prior Function  Level of Independence Needs assistance  [facility], was able to walk with RW with PT at ALF  Communication  Communication No difficulties  Pain Assessment  Pain Assessment No/denies pain  Cognition  Arousal/Alertness Awake/alert  Behavior During Therapy WFL for tasks assessed/performed  Overall Cognitive Status Impaired/Different from baseline  Upper Extremity Assessment  Upper Extremity Assessment Generalized weakness (grossly 3/5)  Lower Extremity Assessment  Lower Extremity Assessment Generalized weakness (grossly 3/5)  Bed Mobility  Overal bed mobility Needs Assistance  Bed Mobility Supine to Sit  Supine to sit Max assist  General bed mobility comments assist for B LE sliding off bed and scooting out towards EOB. Once seated at EOB, pt with multiple LOB in post direction with min assist for correction. Pt not able to maintain balance independently  Transfers  Overall transfer level Needs assistance  Equipment used 1 person hand held assist  Transfers Sit to/from Stand  Sit to Stand Max assist  General transfer comment assist for blocking knees as they started buckling during transfer. Stand transfer performed with max assist. Pt unable to maintain standing and sat back on bed after 2 seconds. Unable to progress further mobility  Ambulation/Gait  General Gait Details unable   Balance  Overall balance assessment Needs assistance  Sitting-balance support Feet supported  Sitting balance-Leahy  Scale Poor  Standing balance support Bilateral upper extremity supported (on therapist)  Standing balance-Leahy Scale Poor  PT - End of Session  Equipment Utilized During Treatment Gait belt  Activity Tolerance Patient limited by fatigue;Patient limited by lethargy  Patient left in bed;with bed alarm set  Nurse Communication Mobility status  PT Assessment  PT Therapy Diagnosis  Difficulty walking;Abnormality of gait;Generalized weakness  PT Recommendation/Assessment Patient needs continued PT services  PT Problem List Decreased strength;Decreased balance;Decreased mobility;Decreased coordination  PT Plan  PT Frequency (ACUTE ONLY) Min 2X/week  PT Treatment/Interventions (ACUTE ONLY) Gait training;Therapeutic exercise  PT Recommendation  Follow Up Recommendations SNF  Individuals Consulted  Consulted and Agree with Results and Recommendations Family member/caregiver  Family Member Consulted spouse  Acute Rehab PT Goals  Patient Stated Goal to get stronger  PT Goal Formulation With patient  Time For Goal Achievement 09/14/15  Potential to Achieve Goals Good  PT Time Calculation  PT Start Time (ACUTE ONLY) 1014  PT Stop Time (ACUTE ONLY) 1041  PT Time Calculation (min) (ACUTE ONLY) 27 min  PT G-Codes **NOT FOR INPATIENT CLASS**  Functional Assessment Tool Used clinical judgement  Functional Limitation Mobility: Walking and moving around  Mobility: Walking and Moving Around Current Status (Z6109(G8978) CJ  Mobility: Walking and Moving Around Goal Status (U0454(G8979) CI  PT General Charges  $$ ACUTE PT VISIT 1 Procedure  PT Evaluation  $PT Eval Moderate Complexity 1 Procedure  Late G codes entry.  Elizabeth PalauStephanie Kelcy Baeten, PT, DPT [phone]

## 2016-02-28 IMAGING — CR DG ABDOMEN ACUTE W/ 1V CHEST
3 series · 3 of 3 positions shown · non-contrast
Comparison: None.

CLINICAL DATA: Known C. difficile infection with abdominal pain

EXAM:
DG ABDOMEN ACUTE W/ 1V CHEST

[chest pa]
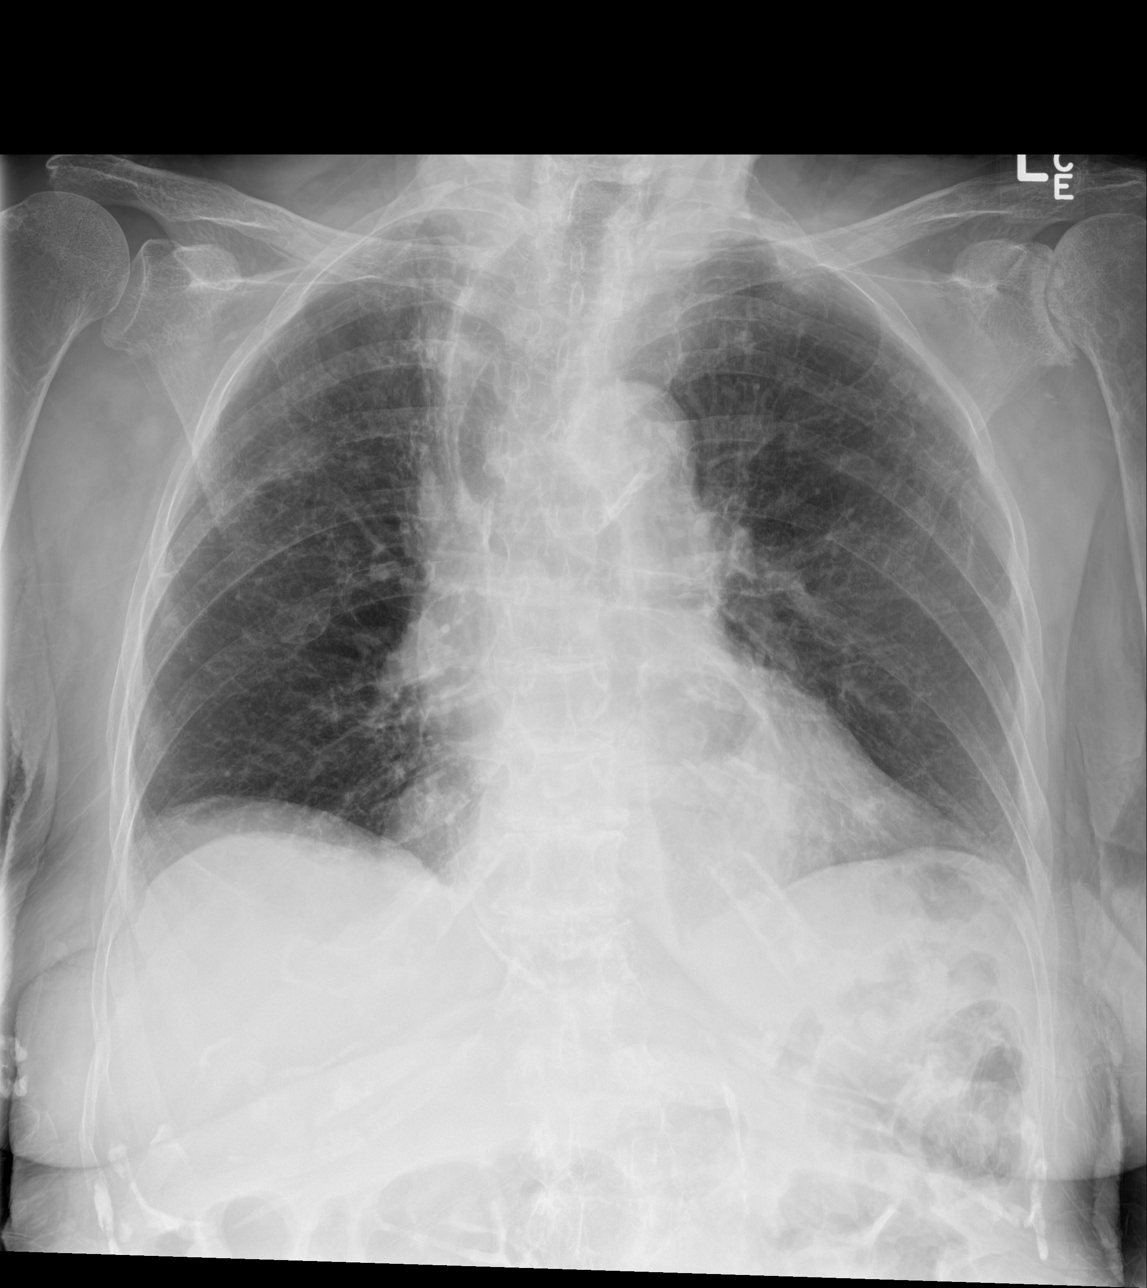

[abdomen erect]
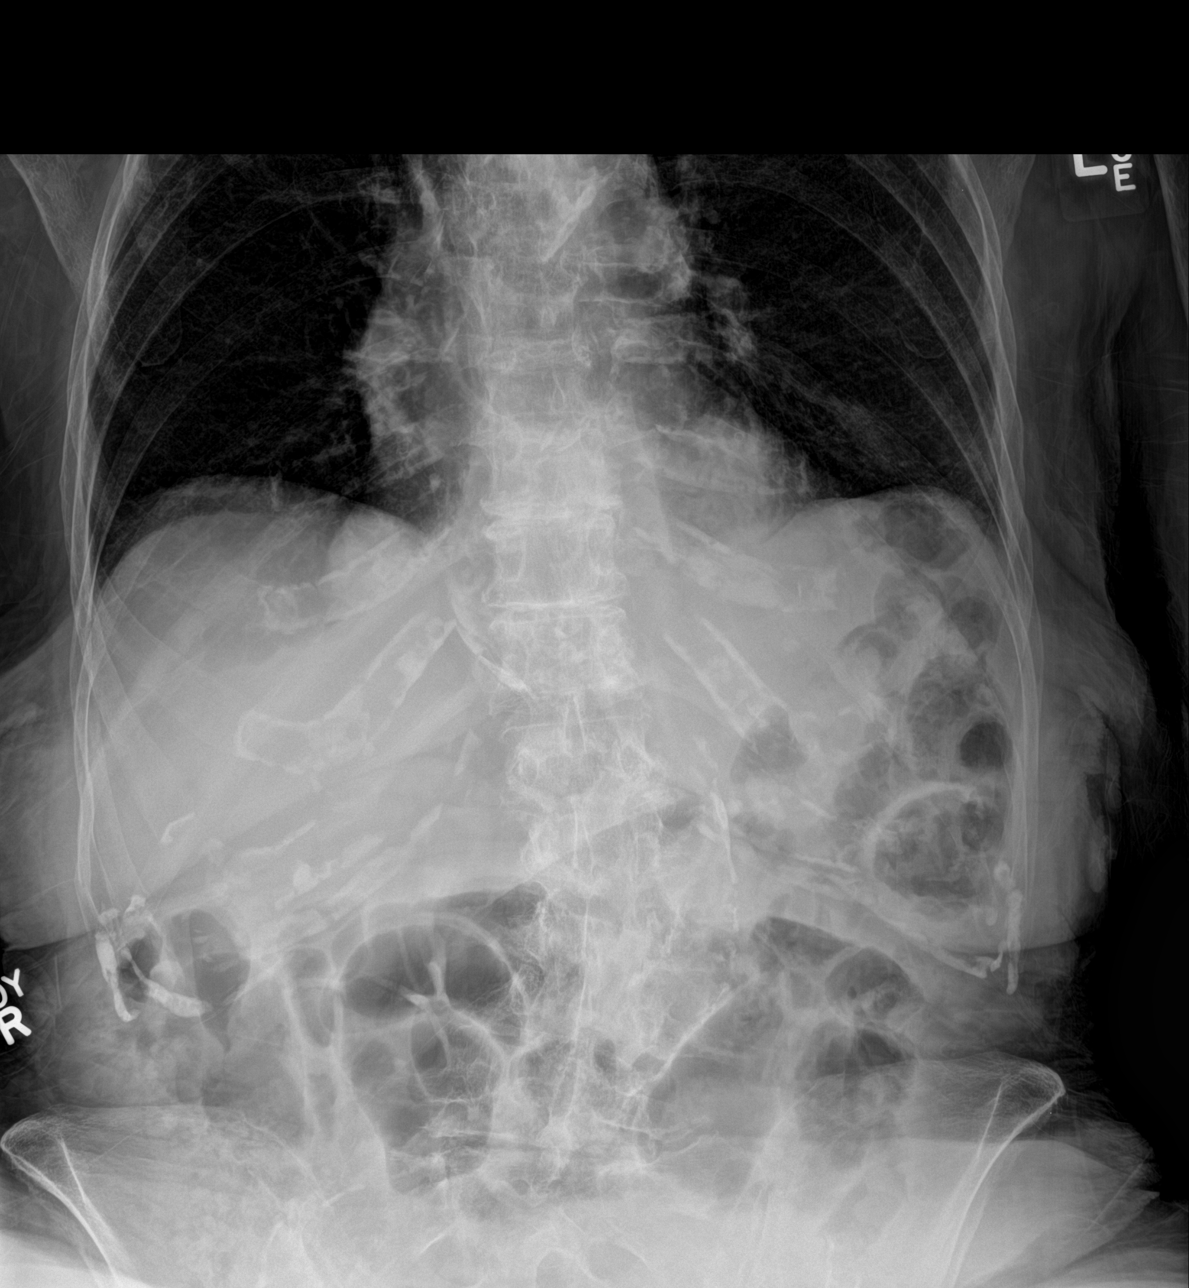

[abdomen supine]
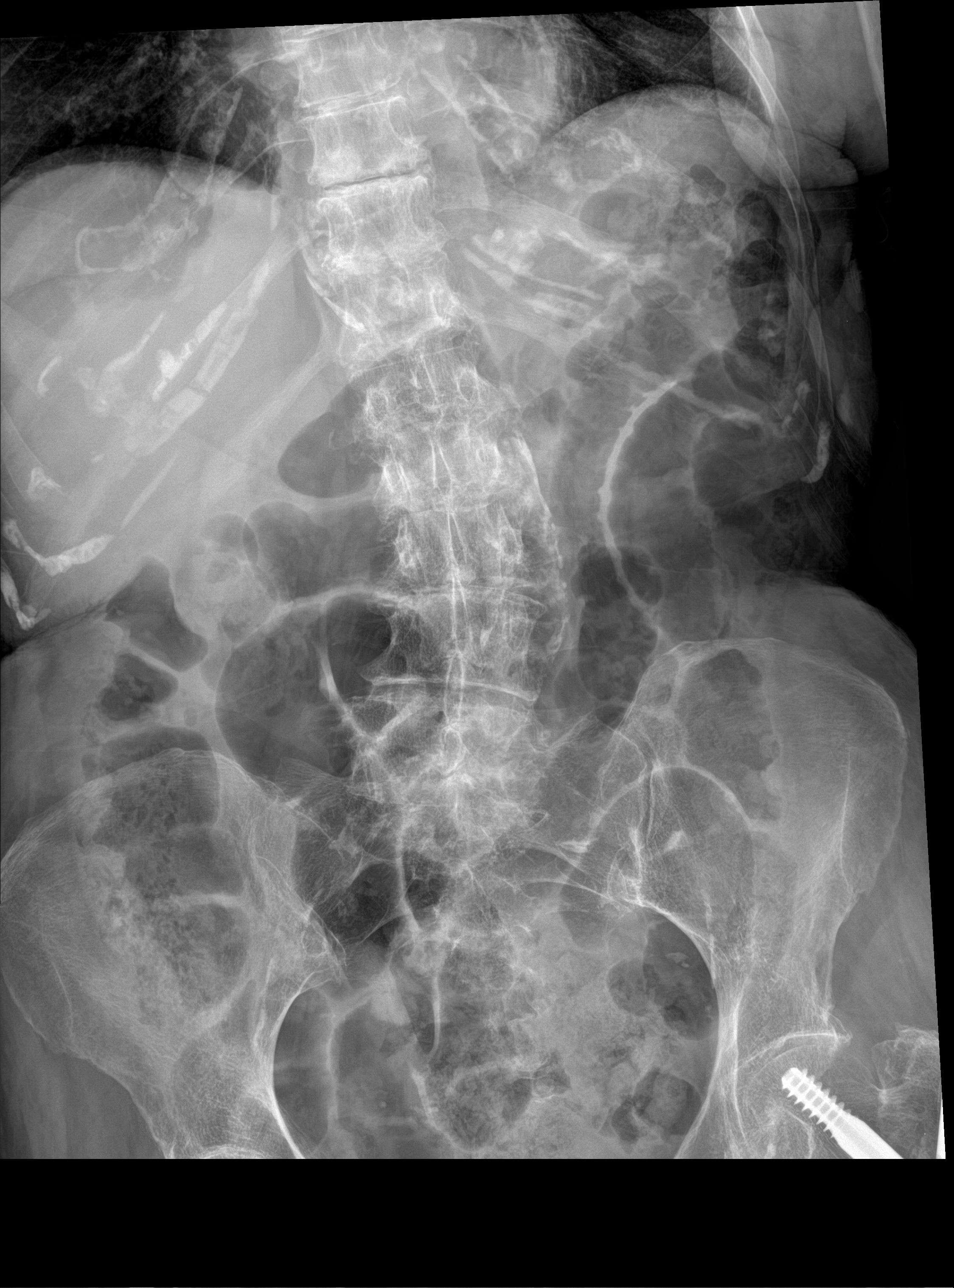

[3 of 3 positions shown; findings below may reference images not displayed]

FINDINGS: Cardiac shadow is within normal limits. The lungs are well aerated
bilaterally. Diffuse interstitial changes and scarring are noted. No
acute infiltrate is seen.

The abdomen demonstrates scattered large and small bowel gas. No
obstructive changes are seen. Degenerative changes of the lumbar
spine are noted. Aortic calcifications are seen. Changes of prior
fixation of the proximal left femur are noted.
IMPRESSION: No acute abnormality in the chest and abdomen.
